# Patient Record
Sex: Female | Born: 2000 | Hispanic: No | Marital: Single | Smoking: Current every day smoker
Health system: Southern US, Community
[De-identification: ages and names within clinical notes are randomized; demographics above are authoritative.]

## PROBLEM LIST (undated history)

## (undated) DIAGNOSIS — N83209 Unspecified ovarian cyst, unspecified side: Secondary | ICD-10-CM

---
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
## 2018-10-25 ENCOUNTER — Emergency Department
Admission: EM | Admit: 2018-10-25 | Discharge: 2018-10-25 | Disposition: A | Discharge status: Home / Self Care | Payer: Medicaid Other | Attending: Emergency Medicine | Admitting: Emergency Medicine

## 2018-10-25 ENCOUNTER — Encounter: Payer: Self-pay | Admitting: Emergency Medicine

## 2018-10-25 ENCOUNTER — Other Ambulatory Visit: Payer: Self-pay

## 2018-10-25 ENCOUNTER — Emergency Department: Payer: Medicaid Other

## 2018-10-25 DIAGNOSIS — R102 Pelvic and perineal pain: Secondary | ICD-10-CM | POA: Diagnosis not present

## 2018-10-25 DIAGNOSIS — Z793 Long term (current) use of hormonal contraceptives: Secondary | ICD-10-CM | POA: Insufficient documentation

## 2018-10-25 DIAGNOSIS — F17210 Nicotine dependence, cigarettes, uncomplicated: Secondary | ICD-10-CM | POA: Insufficient documentation

## 2018-10-25 DIAGNOSIS — N939 Abnormal uterine and vaginal bleeding, unspecified: Secondary | ICD-10-CM | POA: Diagnosis not present

## 2018-10-25 DIAGNOSIS — N83201 Unspecified ovarian cyst, right side: Secondary | ICD-10-CM | POA: Diagnosis not present

## 2018-10-25 LAB — URINALYSIS, COMPLETE (UACMP) WITH MICROSCOPIC
Bacteria, UA: NONE SEEN
Bilirubin Urine: NEGATIVE
Glucose, UA: NEGATIVE mg/dL
Ketones, ur: 20 mg/dL — AB
Nitrite: NEGATIVE
Protein, ur: NEGATIVE mg/dL
Specific Gravity, Urine: 1.004 — ABNORMAL LOW (ref 1.005–1.030)
pH: 6 (ref 5.0–8.0)

## 2018-10-25 LAB — BASIC METABOLIC PANEL
Anion gap: 11 (ref 5–15)
BUN: 14 mg/dL (ref 6–20)
CO2: 22 mmol/L (ref 22–32)
Calcium: 9.5 mg/dL (ref 8.9–10.3)
Chloride: 106 mmol/L (ref 98–111)
Creatinine, Ser: 0.71 mg/dL (ref 0.44–1.00)
GFR calc Af Amer: >60 mL/min (ref >60)
GFR calc non Af Amer: >60 mL/min (ref >60)
Glucose, Bld: 93 mg/dL (ref 70–99)
Potassium: 3.4 mmol/L — ABNORMAL LOW (ref 3.5–5.1)
Sodium: 139 mmol/L (ref 135–145)

## 2018-10-25 LAB — CBC
HCT: 41.5 % (ref 36.0–46.0)
Hemoglobin: 13.7 g/dL (ref 12.0–15.0)
MCH: 31.2 pg (ref 26.0–34.0)
MCHC: 33 g/dL (ref 30.0–36.0)
MCV: 94.5 fL (ref 80.0–100.0)
Platelets: 288 10*3/uL (ref 150–400)
RBC: 4.39 MIL/uL (ref 3.87–5.11)
RDW: 12.2 % (ref 11.5–15.5)
WBC: 11.2 10*3/uL — ABNORMAL HIGH (ref 4.0–10.5)
nRBC: 0 % (ref 0.0–0.2)

## 2018-10-25 LAB — POC URINE PREG, ED: Preg Test, Ur: NEGATIVE

## 2018-10-25 MED ORDER — TRAMADOL HCL 50 MG PO TABS: 50.0000 mg | ORAL_TABLET | Freq: Four times a day (QID) | ORAL | 0 refills | Status: DC | PRN

## 2018-10-25 NOTE — ED Provider Notes (Signed)
[facility] Emergency Department Provider Note  ____________________________________________   First MD Initiated Contact with Patient 10/25/18 1437     (approximate)  I have reviewed the triage vital signs and the nursing notes.   HISTORY  Chief Complaint Vaginal Bleeding   HPI Sharon Underwood is a 18 y.o. female presents to the emergency department due to vaginal bleeding with "passing large clots" since yesterday.  Patient states her last menstrual period was 2 weeks ago.  Menstruation before that was in July.  Patient does admit to irregular menses.  Patient admits to pelvic and low back pain.  Patient denies any nausea vomiting diarrhea constipation or fever.        History reviewed. No pertinent past medical history.  There are no active problems to display for this patient.   History reviewed. No pertinent surgical history.  Prior to Admission medications   Medication Sig Start Date End Date Taking? Authorizing Provider  albuterol (PROVENTIL HFA;VENTOLIN HFA) 108 (90 Base) MCG/ACT inhaler Inhale 2 puffs into the lungs every 6 (six) hours as needed for wheezing or shortness of breath. 07/01/17   Jeanmarie Plant, MD  medroxyPROGESTERone (DEPO-PROVERA) 150 MG/ML injection use as directed inject 1 milliliter at physician's office every 3 months 02/22/15   [provider]  ondansetron (ZOFRAN) 4 MG tablet Take 1 tablet (4 mg total) by mouth daily as needed for nausea or vomiting. 07/01/17   Jeanmarie Plant, MD  traMADol (ULTRAM) 50 MG tablet Take 1 tablet (50 mg total) by mouth every 6 (six) hours as needed. 10/25/18 10/25/19  Darci Current, MD    Allergies Patient has no known allergies.  No family history on file.  Social History Social History   Tobacco Use  . Smoking status: Current Every Day Smoker    Types: Cigarettes  . Smokeless tobacco: Never Used  Substance Use Topics  . Alcohol use: No  . Drug use: Not Currently     Review of Systems Constitutional: No fever/chills Eyes: No visual changes. ENT: No sore throat. Cardiovascular: Denies chest pain. Respiratory: Denies shortness of breath. Gastrointestinal: Positive for pelvic pain.  No nausea, no vomiting.  No diarrhea.  No constipation. Genitourinary: Positive for vaginal bleeding Musculoskeletal: Negative for neck pain.  Positive for back pain. Integumentary: Negative for rash. Neurological: Negative for headaches, focal weakness or numbness.   ____________________________________________   PHYSICAL EXAM:  VITAL SIGNS: ED Triage Vitals  Enc Vitals Group     BP 10/25/18 1322 125/82     Pulse Rate 10/25/18 1322 96     Resp 10/25/18 1322 20     Temp 10/25/18 1322 98 F (36.7 C)     Temp Source 10/25/18 1322 Oral     SpO2 10/25/18 1322 97 %     Weight 10/25/18 1321 72.6 kg (160 lb)     Height 10/25/18 1321 1.575 m (5\' 2" )     Head Circumference --      Peak Flow --      Pain Score 10/25/18 1328 2     Pain Loc --      Pain Edu? --      Excl. in GC? --     Constitutional: Alert and oriented.  Eyes: Conjunctivae are normal.  Mouth/Throat: Mucous membranes are moist. Neck: No stridor.  No meningeal signs.   Cardiovascular: Normal rate, regular rhythm. Good peripheral circulation. Grossly normal heart sounds. Respiratory: Normal respiratory effort.  No retractions. Gastrointestinal: Lower quadrant/left lower  quadrant/suprapubic tenderness to palpation.  No distention.  Musculoskeletal: No lower extremity tenderness nor edema. No gross deformities of extremities. Neurologic:  Normal speech and language. No gross focal neurologic deficits are appreciated.  Skin:  Skin is warm, dry and intact. Psychiatric: Mood and affect are normal. Speech and behavior are normal.  ____________________________________________   LABS (all labs ordered are listed, but only abnormal results are displayed)  Labs Reviewed  BASIC METABOLIC PANEL - Abnormal;  Notable for the following components:      Result Value   Potassium 3.4 (*)    All other components within normal limits  CBC - Abnormal; Notable for the following components:   WBC 11.2 (*)    All other components within normal limits  URINALYSIS, COMPLETE (UACMP) WITH MICROSCOPIC - Abnormal; Notable for the following components:   Color, Urine STRAW (*)    APPearance CLEAR (*)    Specific Gravity, Urine 1.004 (*)    Hgb urine dipstick MODERATE (*)    Ketones, ur 20 (*)    Leukocytes,Ua TRACE (*)    All other components within normal limits  POC URINE PREG, ED    RADIOLOGY I, Kalida N Brylyn Novakovich, personally viewed and evaluated these images (plain radiographs) as part of my medical decision making, as well as reviewing the written report by the radiologist.  ED MD interpretation: Complex hypoechoic area within the right adnexa favoring a hemorrhagic ovarian cyst per radiologist on pelvic ultrasound interpretation.  Official radiology report(s): [place] Pelvic Complete With Transvaginal  Result Date: 10/25/2018 CLINICAL DATA:  Pelvic pain, vaginal bleeding EXAM: TRANSABDOMINAL AND TRANSVAGINAL ULTRASOUND OF PELVIS TECHNIQUE: Both transabdominal and transvaginal ultrasound examinations of the pelvis were performed. Transabdominal technique was performed for global imaging of the pelvis including uterus, ovaries, adnexal regions, and pelvic cul-de-sac. It was necessary to proceed with endovaginal exam following the transabdominal exam to visualize the uterus, endometrium, ovaries and adnexa. COMPARISON:  None FINDINGS: Uterus Measurements: 7.0 x 3.6 x 3.7 cm = volume: 49 mL. No fibroids or other mass visualized. Uterus is retroverted. Endometrium Thickness: 11 mm.  No focal abnormality visualized. Right ovary Measurements: 3.2 x 1.6 x 1.8 cm = volume: 4.9 mL. Complex hypoechoic area noted in the right adnexa measures 3.4 x 2.3 x 2.2 cm. Legrand RamsFavor this represents a hemorrhagic cyst within the right ovary.  This also could be tubular. Left ovary Measurements: 1.0 x 1.4 x 0.6 = volume: 0.4 mL. Normal appearance/no adnexal mass. Other findings No abnormal free fluid. IMPRESSION: Complex hypoechoic area within the right adnexa, favor hemorrhagic right ovarian cyst although this could be tubular in origin as well. This could be followed with repeat ultrasound in 3-6 months to ensure resolution. Electronically Signed   By: Charlett NoseKevin  Dover M.D.   On: 10/25/2018 16:23      Procedures   ____________________________________________   INITIAL IMPRESSION / MDM / ASSESSMENT AND PLAN / ED COURSE  As part of my medical decision making, I reviewed the following data within the electronic MEDICAL RECORD NUMBER   18 year old female presented with above-stated history and physical exam secondary to lower abdominal discomfort back pain and vaginal bleeding.  Urine pregnancy test negative.  Ultrasound performed to evaluate for possible ovarian cysts etiologies of dysfunctional uterine bleeding.  Ultrasound revealed a right hemorrhagic ovarian cyst.  I spoke with the patient at length regarding warning signs that would warrant immediate return to the emergency department including worsening pain fever.  Patient will be referred to OB/GYN Dr. Dalbert GarnetBeasley for  further outpatient evaluation.  ____________________________________________  FINAL CLINICAL IMPRESSION(S) / ED DIAGNOSES  Final diagnoses:  Vaginal bleeding  Pelvic pain  Cyst of right ovary     MEDICATIONS GIVEN DURING THIS VISIT:  Medications - No data to display   ED Discharge Orders         Ordered    traMADol (ULTRAM) 50 MG tablet  Every 6 hours PRN     10/25/18 1644          *Please note:  Sharon Underwood was evaluated in Emergency Department on 10/25/2018 for the symptoms described in the history of present illness. She was evaluated in the context of the global COVID-19 pandemic, which necessitated consideration that the patient might be at risk for  infection with the SARS-CoV-2 virus that causes COVID-19. Institutional protocols and algorithms that pertain to the evaluation of patients at risk for COVID-19 are in a state of rapid change based on information released by regulatory bodies including the CDC and federal and state organizations. These policies and algorithms were followed during the patient's care in the ED.  Some ED evaluations and interventions may be delayed as a result of limited staffing during the pandemic.*  Note:  This document was prepared using Dragon voice recognition software and may include unintentional dictation errors.   Gregor Hams, MD 10/25/18 [phone]

## 2018-10-25 NOTE — ED Triage Notes (Signed)
Pt c/o heavy vaginal bleeding that started yesterday with passing clots, states she just had her normal period 2 weeks ago.

## 2018-10-25 NOTE — ED Notes (Signed)
Patient transported to Ultrasound 

## 2018-12-22 ENCOUNTER — Encounter: Payer: Self-pay | Admitting: Emergency Medicine

## 2018-12-22 ENCOUNTER — Other Ambulatory Visit: Payer: Self-pay

## 2018-12-22 ENCOUNTER — Emergency Department: Payer: Medicaid Other

## 2018-12-22 ENCOUNTER — Observation Stay: Payer: Medicaid Other | Admitting: Anesthesiology

## 2018-12-22 ENCOUNTER — Encounter
Admission: EM | Disposition: A | Discharge status: Home / Self Care | Payer: Self-pay | Source: Home / Self Care | Attending: Emergency Medicine

## 2018-12-22 ENCOUNTER — Observation Stay
Admission: EM | Admit: 2018-12-22 | Discharge: 2018-12-23 | Disposition: A | Discharge status: Home / Self Care | Payer: Medicaid Other | Attending: Surgery | Admitting: Surgery

## 2018-12-22 DIAGNOSIS — Z20828 Contact with and (suspected) exposure to other viral communicable diseases: Secondary | ICD-10-CM | POA: Diagnosis not present

## 2018-12-22 DIAGNOSIS — J45909 Unspecified asthma, uncomplicated: Secondary | ICD-10-CM | POA: Diagnosis not present

## 2018-12-22 DIAGNOSIS — Z793 Long term (current) use of hormonal contraceptives: Secondary | ICD-10-CM | POA: Insufficient documentation

## 2018-12-22 DIAGNOSIS — K3580 Unspecified acute appendicitis: Principal | ICD-10-CM | POA: Insufficient documentation

## 2018-12-22 DIAGNOSIS — F17210 Nicotine dependence, cigarettes, uncomplicated: Secondary | ICD-10-CM | POA: Insufficient documentation

## 2018-12-22 DIAGNOSIS — K37 Unspecified appendicitis: Secondary | ICD-10-CM | POA: Diagnosis present

## 2018-12-22 HISTORY — PX: LAPAROSCOPIC APPENDECTOMY: SHX408

## 2018-12-22 LAB — URINALYSIS, COMPLETE (UACMP) WITH MICROSCOPIC
Bacteria, UA: NONE SEEN
Bilirubin Urine: NEGATIVE
Glucose, UA: NEGATIVE mg/dL
Hgb urine dipstick: NEGATIVE
Ketones, ur: 5 mg/dL — AB
Nitrite: NEGATIVE
Protein, ur: 30 mg/dL — AB
Specific Gravity, Urine: 1.031 — ABNORMAL HIGH (ref 1.005–1.030)
pH: 5 (ref 5.0–8.0)

## 2018-12-22 LAB — CBC WITH DIFFERENTIAL/PLATELET
Abs Immature Granulocytes: 0.06 10*3/uL (ref 0.00–0.07)
Basophils Absolute: 0.1 10*3/uL (ref 0.0–0.1)
Basophils Relative: 0 %
Eosinophils Absolute: 0.2 10*3/uL (ref 0.0–0.5)
Eosinophils Relative: 1 %
HCT: 41.1 % (ref 36.0–46.0)
Hemoglobin: 13.3 g/dL (ref 12.0–15.0)
Immature Granulocytes: 0 %
Lymphocytes Relative: 17 %
Lymphs Abs: 2.9 10*3/uL (ref 0.7–4.0)
MCH: 31.4 pg (ref 26.0–34.0)
MCHC: 32.4 g/dL (ref 30.0–36.0)
MCV: 97.2 fL (ref 80.0–100.0)
Monocytes Absolute: 0.9 10*3/uL (ref 0.1–1.0)
Monocytes Relative: 6 %
Neutro Abs: 12.8 10*3/uL — ABNORMAL HIGH (ref 1.7–7.7)
Neutrophils Relative %: 76 %
Platelets: 352 10*3/uL (ref 150–400)
RBC: 4.23 MIL/uL (ref 3.87–5.11)
RDW: 12.2 % (ref 11.5–15.5)
WBC: 16.9 10*3/uL — ABNORMAL HIGH (ref 4.0–10.5)
nRBC: 0 % (ref 0.0–0.2)

## 2018-12-22 LAB — TROPONIN I (HIGH SENSITIVITY): Troponin I (High Sensitivity): <2 ng/L (ref <18)

## 2018-12-22 LAB — COMPREHENSIVE METABOLIC PANEL
ALT: 30 U/L (ref 0–44)
AST: 18 U/L (ref 15–41)
Albumin: 4.9 g/dL (ref 3.5–5.0)
Alkaline Phosphatase: 76 U/L (ref 38–126)
Anion gap: 11 (ref 5–15)
BUN: 15 mg/dL (ref 6–20)
CO2: 25 mmol/L (ref 22–32)
Calcium: 9.2 mg/dL (ref 8.9–10.3)
Chloride: 104 mmol/L (ref 98–111)
Creatinine, Ser: 1.03 mg/dL — ABNORMAL HIGH (ref 0.44–1.00)
GFR calc Af Amer: >60 mL/min (ref >60)
GFR calc non Af Amer: >60 mL/min (ref >60)
Glucose, Bld: 109 mg/dL — ABNORMAL HIGH (ref 70–99)
Potassium: 3.3 mmol/L — ABNORMAL LOW (ref 3.5–5.1)
Sodium: 140 mmol/L (ref 135–145)
Total Bilirubin: 0.4 mg/dL (ref 0.3–1.2)
Total Protein: 8.1 g/dL (ref 6.5–8.1)

## 2018-12-22 LAB — POCT PREGNANCY, URINE: Preg Test, Ur: NEGATIVE

## 2018-12-22 LAB — URINE DRUG SCREEN, QUALITATIVE (ARMC ONLY)
Amphetamines, Ur Screen: NOT DETECTED
Barbiturates, Ur Screen: NOT DETECTED
Benzodiazepine, Ur Scrn: NOT DETECTED
Cannabinoid 50 Ng, Ur ~~LOC~~: POSITIVE — AB
Cocaine Metabolite,Ur ~~LOC~~: NOT DETECTED
MDMA (Ecstasy)Ur Screen: NOT DETECTED
Methadone Scn, Ur: NOT DETECTED
Opiate, Ur Screen: POSITIVE — AB
Phencyclidine (PCP) Ur S: NOT DETECTED
Tricyclic, Ur Screen: NOT DETECTED

## 2018-12-22 LAB — SARS CORONAVIRUS 2 BY RT PCR (HOSPITAL ORDER, PERFORMED IN ~~LOC~~ HOSPITAL LAB): SARS Coronavirus 2: NEGATIVE

## 2018-12-22 LAB — LIPASE, BLOOD: Lipase: 13 U/L (ref 11–51)

## 2018-12-22 LAB — HIV ANTIBODY (ROUTINE TESTING W REFLEX): HIV Screen 4th Generation wRfx: NONREACTIVE

## 2018-12-22 SURGERY — APPENDECTOMY, LAPAROSCOPIC
Anesthesia: General | Site: Abdomen

## 2018-12-22 MED ORDER — PIPERACILLIN-TAZOBACTAM 3.375 G IVPB
3.3750 g | Freq: Three times a day (TID) | INTRAVENOUS | Status: DC
  Administered 2018-12-22 – 2018-12-23 (×3): 3.375 g via INTRAVENOUS
  Filled 2018-12-22 (×5): qty 50

## 2018-12-22 MED ORDER — ONDANSETRON HCL 4 MG/2ML IJ SOLN
4.0000 mg | Freq: Once | INTRAMUSCULAR | Status: AC | PRN
  Administered 2018-12-22: 19:00:00 4 mg via INTRAVENOUS

## 2018-12-22 MED ORDER — SODIUM CHLORIDE 0.9 % IV SOLN: INTRAVENOUS | Status: DC

## 2018-12-22 MED ORDER — ROCURONIUM BROMIDE 50 MG/5ML IV SOLN
INTRAVENOUS | Status: AC
  Filled 2018-12-22: qty 1

## 2018-12-22 MED ORDER — ONDANSETRON HCL 4 MG/2ML IJ SOLN
4.0000 mg | Freq: Once | INTRAMUSCULAR | Status: AC
  Administered 2018-12-22: 4 mg via INTRAVENOUS
  Filled 2018-12-22: qty 2

## 2018-12-22 MED ORDER — PROPOFOL 10 MG/ML IV BOLUS
INTRAVENOUS | Status: DC | PRN
  Administered 2018-12-22: 200 mg via INTRAVENOUS

## 2018-12-22 MED ORDER — ONDANSETRON HCL 4 MG/2ML IJ SOLN
4.0000 mg | Freq: Four times a day (QID) | INTRAMUSCULAR | Status: DC | PRN
  Administered 2018-12-22: 4 mg via INTRAVENOUS

## 2018-12-22 MED ORDER — MORPHINE SULFATE (PF) 2 MG/ML IV SOLN
2.0000 mg | INTRAVENOUS | Status: DC | PRN
  Administered 2018-12-22 (×2): 2 mg via INTRAVENOUS
  Filled 2018-12-22 (×2): qty 1

## 2018-12-22 MED ORDER — KETOROLAC TROMETHAMINE 30 MG/ML IJ SOLN
INTRAMUSCULAR | Status: DC | PRN
  Administered 2018-12-22: 30 mg via INTRAVENOUS

## 2018-12-22 MED ORDER — ACETAMINOPHEN 10 MG/ML IV SOLN
INTRAVENOUS | Status: AC
  Filled 2018-12-22: qty 100

## 2018-12-22 MED ORDER — SUCCINYLCHOLINE CHLORIDE 20 MG/ML IJ SOLN
INTRAMUSCULAR | Status: DC | PRN
  Administered 2018-12-22: 100 mg via INTRAVENOUS

## 2018-12-22 MED ORDER — ONDANSETRON HCL 4 MG/2ML IJ SOLN
INTRAMUSCULAR | Status: DC | PRN
  Administered 2018-12-22: 4 mg via INTRAVENOUS

## 2018-12-22 MED ORDER — ACETAMINOPHEN 10 MG/ML IV SOLN
INTRAVENOUS | Status: DC | PRN
  Administered 2018-12-22: 1000 mg via INTRAVENOUS

## 2018-12-22 MED ORDER — GLYCOPYRROLATE 0.2 MG/ML IJ SOLN
INTRAMUSCULAR | Status: DC | PRN
  Administered 2018-12-22: 0.2 mg via INTRAVENOUS

## 2018-12-22 MED ORDER — HEPARIN SODIUM (PORCINE) 5000 UNIT/ML IJ SOLN
5000.0000 [IU] | Freq: Three times a day (TID) | INTRAMUSCULAR | Status: DC
  Administered 2018-12-23: 5000 [IU] via SUBCUTANEOUS
  Filled 2018-12-22 (×3): qty 1

## 2018-12-22 MED ORDER — OXYCODONE HCL 5 MG PO TABS
5.0000 mg | ORAL_TABLET | ORAL | Status: DC | PRN
  Administered 2018-12-23: 10 mg via ORAL
  Administered 2018-12-23: 5 mg via ORAL
  Filled 2018-12-22: qty 1
  Filled 2018-12-22: qty 2

## 2018-12-22 MED ORDER — FENTANYL CITRATE (PF) 100 MCG/2ML IJ SOLN
INTRAMUSCULAR | Status: DC | PRN
  Administered 2018-12-22 (×4): 50 ug via INTRAVENOUS

## 2018-12-22 MED ORDER — LIDOCAINE HCL (CARDIAC) PF 100 MG/5ML IV SOSY
PREFILLED_SYRINGE | INTRAVENOUS | Status: DC | PRN
  Administered 2018-12-22: 50 mg via INTRAVENOUS

## 2018-12-22 MED ORDER — BUPIVACAINE HCL (PF) 0.25 % IJ SOLN
INTRAMUSCULAR | Status: AC
  Filled 2018-12-22: qty 30

## 2018-12-22 MED ORDER — ONDANSETRON 4 MG PO TBDP: 4.0000 mg | ORAL_TABLET | Freq: Four times a day (QID) | ORAL | Status: DC | PRN

## 2018-12-22 MED ORDER — BUPIVACAINE-EPINEPHRINE 0.25% -1:200000 IJ SOLN
INTRAMUSCULAR | Status: DC | PRN
  Administered 2018-12-22: 30 mL

## 2018-12-22 MED ORDER — MORPHINE SULFATE (PF) 4 MG/ML IV SOLN
4.0000 mg | Freq: Once | INTRAVENOUS | Status: AC
  Administered 2018-12-22: 4 mg via INTRAVENOUS
  Filled 2018-12-22: qty 1

## 2018-12-22 MED ORDER — FENTANYL CITRATE (PF) 100 MCG/2ML IJ SOLN
INTRAMUSCULAR | Status: AC
  Filled 2018-12-22: qty 2

## 2018-12-22 MED ORDER — PROCHLORPERAZINE MALEATE 10 MG PO TABS
10.0000 mg | ORAL_TABLET | Freq: Four times a day (QID) | ORAL | Status: DC | PRN
  Filled 2018-12-22: qty 1

## 2018-12-22 MED ORDER — SODIUM CHLORIDE 0.9 % IV BOLUS
1000.0000 mL | Freq: Once | INTRAVENOUS | Status: AC
  Administered 2018-12-22: 1000 mL via INTRAVENOUS

## 2018-12-22 MED ORDER — MORPHINE SULFATE (PF) 2 MG/ML IV SOLN
2.0000 mg | Freq: Once | INTRAVENOUS | Status: AC
  Administered 2018-12-22: 2 mg via INTRAVENOUS
  Filled 2018-12-22: qty 1

## 2018-12-22 MED ORDER — SUGAMMADEX SODIUM 200 MG/2ML IV SOLN
INTRAVENOUS | Status: DC | PRN
  Administered 2018-12-22: 200 mg via INTRAVENOUS

## 2018-12-22 MED ORDER — DEXAMETHASONE SODIUM PHOSPHATE 10 MG/ML IJ SOLN
INTRAMUSCULAR | Status: DC | PRN
  Administered 2018-12-22: 10 mg via INTRAVENOUS

## 2018-12-22 MED ORDER — DEXMEDETOMIDINE HCL IN NACL 200 MCG/50ML IV SOLN
INTRAVENOUS | Status: DC | PRN
  Administered 2018-12-22 (×2): 12 ug via INTRAVENOUS

## 2018-12-22 MED ORDER — PIPERACILLIN-TAZOBACTAM 3.375 G IVPB 30 MIN
3.3750 g | Freq: Once | INTRAVENOUS | Status: AC
  Administered 2018-12-22: 3.375 g via INTRAVENOUS
  Filled 2018-12-22: qty 50

## 2018-12-22 MED ORDER — IOHEXOL 300 MG/ML  SOLN
100.0000 mL | Freq: Once | INTRAMUSCULAR | Status: AC | PRN
  Administered 2018-12-22: 100 mL via INTRAVENOUS

## 2018-12-22 MED ORDER — ONDANSETRON HCL 4 MG/2ML IJ SOLN
4.0000 mg | Freq: Four times a day (QID) | INTRAMUSCULAR | Status: DC | PRN
  Filled 2018-12-22: qty 2

## 2018-12-22 MED ORDER — ROCURONIUM BROMIDE 100 MG/10ML IV SOLN
INTRAVENOUS | Status: DC | PRN
  Administered 2018-12-22: 35 mg via INTRAVENOUS
  Administered 2018-12-22: 5 mg via INTRAVENOUS

## 2018-12-22 MED ORDER — KETOROLAC TROMETHAMINE 30 MG/ML IJ SOLN
30.0000 mg | Freq: Four times a day (QID) | INTRAMUSCULAR | Status: DC
  Administered 2018-12-22 – 2018-12-23 (×2): 30 mg via INTRAVENOUS
  Filled 2018-12-22 (×2): qty 1

## 2018-12-22 MED ORDER — SODIUM CHLORIDE 0.9 % IV SOLN
INTRAVENOUS | Status: DC
  Administered 2018-12-22 (×2): via INTRAVENOUS

## 2018-12-22 MED ORDER — ONDANSETRON HCL 4 MG/2ML IJ SOLN
INTRAMUSCULAR | Status: AC
  Administered 2018-12-22: 4 mg via INTRAVENOUS
  Filled 2018-12-22: qty 2

## 2018-12-22 MED ORDER — LACTATED RINGERS IV SOLN
INTRAVENOUS | Status: DC | PRN
  Administered 2018-12-22: 17:00:00 via INTRAVENOUS

## 2018-12-22 MED ORDER — MIDAZOLAM HCL 2 MG/2ML IJ SOLN
INTRAMUSCULAR | Status: AC
  Filled 2018-12-22: qty 2

## 2018-12-22 MED ORDER — FENTANYL CITRATE (PF) 100 MCG/2ML IJ SOLN
25.0000 ug | INTRAMUSCULAR | Status: DC | PRN
  Administered 2018-12-22 (×2): 25 ug via INTRAVENOUS

## 2018-12-22 MED ORDER — EPINEPHRINE PF 1 MG/ML IJ SOLN
INTRAMUSCULAR | Status: AC
  Filled 2018-12-22: qty 1

## 2018-12-22 MED ORDER — ONDANSETRON HCL 4 MG/2ML IJ SOLN
INTRAMUSCULAR | Status: AC
  Filled 2018-12-22: qty 2

## 2018-12-22 MED ORDER — DEXAMETHASONE SODIUM PHOSPHATE 10 MG/ML IJ SOLN
INTRAMUSCULAR | Status: AC
  Filled 2018-12-22: qty 1

## 2018-12-22 MED ORDER — MIDAZOLAM HCL 2 MG/2ML IJ SOLN
INTRAMUSCULAR | Status: DC | PRN
  Administered 2018-12-22: 2 mg via INTRAVENOUS

## 2018-12-22 MED ORDER — FENTANYL CITRATE (PF) 100 MCG/2ML IJ SOLN
INTRAMUSCULAR | Status: AC
  Administered 2018-12-22: 25 ug via INTRAVENOUS
  Filled 2018-12-22: qty 2

## 2018-12-22 MED ORDER — PROCHLORPERAZINE EDISYLATE 10 MG/2ML IJ SOLN
5.0000 mg | Freq: Four times a day (QID) | INTRAMUSCULAR | Status: DC | PRN
  Filled 2018-12-22: qty 2

## 2018-12-22 SURGICAL SUPPLY — 38 items
APPLIER CLIP 5 13 M/L LIGAMAX5 (MISCELLANEOUS) ×3
BLADE CLIPPER SURG (BLADE) ×3 IMPLANT
CANISTER SUCT 1200ML W/VALVE (MISCELLANEOUS) ×3 IMPLANT
CHLORAPREP W/TINT 26 (MISCELLANEOUS) ×3 IMPLANT
CLIP APPLIE 5 13 M/L LIGAMAX5 (MISCELLANEOUS) ×1 IMPLANT
COVER WAND RF STERILE (DRAPES) ×3 IMPLANT
CUTTER FLEX LINEAR 45M (STAPLE) ×3 IMPLANT
DERMABOND ADVANCED (GAUZE/BANDAGES/DRESSINGS) ×2
DERMABOND ADVANCED .7 DNX12 (GAUZE/BANDAGES/DRESSINGS) ×1 IMPLANT
ELECT CAUTERY BLADE 6.4 (BLADE) ×3 IMPLANT
ELECT REM PT RETURN 9FT ADLT (ELECTROSURGICAL) ×3
ELECTRODE REM PT RTRN 9FT ADLT (ELECTROSURGICAL) ×1 IMPLANT
GLOVE BIO SURGEON STRL SZ7 (GLOVE) ×3 IMPLANT
GOWN STRL REUS W/ TWL LRG LVL3 (GOWN DISPOSABLE) ×2 IMPLANT
GOWN STRL REUS W/TWL LRG LVL3 (GOWN DISPOSABLE) ×4
IRRIGATION STRYKERFLOW (MISCELLANEOUS) ×1 IMPLANT
IRRIGATOR STRYKERFLOW (MISCELLANEOUS) ×3
IV NS 1000ML (IV SOLUTION) ×2
IV NS 1000ML BAXH (IV SOLUTION) ×1 IMPLANT
NEEDLE HYPO 22GX1.5 SAFETY (NEEDLE) ×3 IMPLANT
NS IRRIG 500ML POUR BTL (IV SOLUTION) ×3 IMPLANT
PACK LAP CHOLECYSTECTOMY (MISCELLANEOUS) ×3 IMPLANT
PENCIL ELECTRO HAND CTR (MISCELLANEOUS) ×3 IMPLANT
POUCH SPECIMEN RETRIEVAL 10MM (ENDOMECHANICALS) ×3 IMPLANT
RELOAD 45 VASCULAR/THIN (ENDOMECHANICALS) ×3 IMPLANT
RELOAD STAPLE TA45 3.5 REG BLU (ENDOMECHANICALS) ×6 IMPLANT
SCISSORS METZENBAUM CVD 33 (INSTRUMENTS) IMPLANT
SHEARS HARMONIC ACE PLUS 36CM (ENDOMECHANICALS) ×3 IMPLANT
SLEEVE ENDOPATH XCEL 5M (ENDOMECHANICALS) ×3 IMPLANT
SPONGE LAP 18X18 RF (DISPOSABLE) ×3 IMPLANT
SPONGE LAP 4X18 RFD (DISPOSABLE) ×3 IMPLANT
SUT MNCRL AB 4-0 PS2 18 (SUTURE) ×6 IMPLANT
SUT VICRYL 0 AB UR-6 (SUTURE) ×6 IMPLANT
SYR 20ML LL LF (SYRINGE) ×3 IMPLANT
TRAY FOLEY MTR SLVR 16FR STAT (SET/KITS/TRAYS/PACK) IMPLANT
TROCAR XCEL BLUNT TIP 100MML (ENDOMECHANICALS) ×3 IMPLANT
TROCAR XCEL NON-BLD 5MMX100MML (ENDOMECHANICALS) ×6 IMPLANT
TUBING EVAC SMOKE HEATED PNEUM (TUBING) ×3 IMPLANT

## 2018-12-22 NOTE — ED Triage Notes (Signed)
Patient ambulatory to triage with steady gait, without difficulty or distress noted, mask in place; pt reports since 9pm having lower chest wall pain radiating into her abd and back accomp by nausea

## 2018-12-22 NOTE — ED Notes (Signed)
Pt's mother called; pt given phone to speak to mother

## 2018-12-22 NOTE — ED Notes (Signed)
Pt last ate/drank at 2200 11/21

## 2018-12-22 NOTE — H&P (Signed)
Patient ID: Sharon Underwood, female   DOB: 05/10/2000, 18 y.o.   MRN: [id number]  HPI Sharon Underwood is a 18 y.o. female seen in consultation for an acute history of abdominal pain within the right lower quadrant.  She reports that the pain sharp started last night has been constant moderate to severe intensity.  Associated with nausea.  She does admit some chills.  No fevers.  Pain get worse when she moves.  She does have decreased appetite.  No sick contacts.  She does admit smoking and using marijuana products.  She is able to perform more than 6 METS of activity without any shortness of breath or chest pain.  Part of the work-up occluded a CT scan of the abdomen and pelvis that I have personally reviewed showing evidence of appendicitis.  No ruptured no free air no abscess.  She also had CBC showing evidence of a white count of 16,000 and a slight bump in creatinine to 1.0.  All the other labs are within normal limits.  HPI  History reviewed. No pertinent past medical history.  History reviewed. No pertinent surgical history.  No pertinent family history Social History Social History   Tobacco Use  . Smoking status: Current Every Day Smoker    Types: Cigarettes  . Smokeless tobacco: Never Used  Substance Use Topics  . Alcohol use: No  . Drug use: Not Currently    No Known Allergies  Current Facility-Administered Medications  Medication Dose Route Frequency Provider Last Rate Last Dose  . 0.9 %  sodium chloride infusion   Intravenous Continuous Donovan Kail, PA-C      . morphine 2 MG/ML injection 2-4 mg  2-4 mg Intravenous Q3H PRN Donovan Kail, PA-C      . ondansetron (ZOFRAN-ODT) disintegrating tablet 4 mg  4 mg Oral Q6H PRN Donovan Kail, PA-C       Or  . ondansetron [facility]) injection 4 mg  4 mg Intravenous Q6H PRN Donovan Kail, PA-C      . piperacillin-tazobactam (ZOSYN) IVPB 3.375 g  3.375 g Intravenous Once Darci Current, MD 100 mL/hr at 12/22/18 0731 3.375  g at 12/22/18 0731  . piperacillin-tazobactam (ZOSYN) IVPB 3.375 g  3.375 g Intravenous Q8H Donovan Kail, [place]       Current Outpatient Medications  Medication Sig Dispense Refill  . albuterol (PROVENTIL HFA;VENTOLIN HFA) 108 (90 Base) MCG/ACT inhaler Inhale 2 puffs into the lungs every 6 (six) hours as needed for wheezing or shortness of breath. 1 Inhaler 2     Review of Systems Full ROS  was asked and was negative except for the information on the HPI  Physical Exam Blood pressure 102/82, pulse 68, temperature 98.4 F (36.9 C), temperature source Oral, resp. rate 11, height 5\' 2"  (1.575 m), weight 74.8 kg, last menstrual period 12/15/2018, SpO2 100 %, unknown if currently breastfeeding. CONSTITUTIONAL: NAD EYES: Pupils are equal, round, and reactive to light, Sclera are non-icteric. EARS, NOSE, MOUTH AND THROAT: The oropharynx is clear. The oral mucosa is pink and moist. Hearing is intact to voice. LYMPH NODES:  Lymph nodes in the neck are normal. RESPIRATORY:  Lungs are clear. There is normal respiratory effort, with equal breath sounds bilaterally, and without pathologic use of accessory muscles. CARDIOVASCULAR: Heart is regular without murmurs, gallops, or rubs. GI: The abdomen is  soft, distended to palpation the right lower quadrant with focal peritonitis .  There is no overt peritonitis.  No  other masses or scars appreciated.  She has normal bowel sounds GU: Rectal deferred.   MUSCULOSKELETAL: Normal muscle strength and tone. No cyanosis or edema.   SKIN: Turgor is good and there are no pathologic skin lesions or ulcers. NEUROLOGIC: Motor and sensation is grossly normal. Cranial nerves are grossly intact. PSYCH:  Oriented to person, place and time. Affect is normal.  Data Reviewed  I have personally reviewed the patient's imaging, laboratory findings and medical records.    Assessment/Plan 18 year old female with classic signs and symptoms of acute appendicitis confirmed  by CT.  Discussed with the patient in detail about her disease process and I do recommend appendectomy.  We will check for urine drug screen since she admits of marijuana products.  She is also pending Covid testing.  We will bolus her with another liter of fluid and we will post her today for laparoscopic appendectomy pending or availability The risks, benefits, complications, treatment options, and expected outcomes were discussed with the patient. The treatment of antibiotics alone was discussed,   Also discussed continuing to the operating room for Laparoscopic Appendectomy.  The possibilities of  bleeding, recurrent infection, perforation of viscus, finding a normal appendix, the need for additional procedures, failure to diagnose a condition, conversion to open procedure and creating a complication requiring transfusion or further operations were discussed. The patient was given the opportunity to ask questions and have them answered.  Patient would like to proceed with Laparoscopic Appendectomy and consent was obtained.     Caroleen Hamman, MD FACS General Surgeon 12/22/2018, 7:58 AM

## 2018-12-22 NOTE — Anesthesia Post-op Follow-up Note (Signed)
Anesthesia QCDR form completed.        

## 2018-12-22 NOTE — Anesthesia Procedure Notes (Signed)
Procedure Name: Intubation Performed by: Jany Buckwalter, CRNA Pre-anesthesia Checklist: Patient identified, Patient being monitored, Timeout performed, Emergency Drugs available and Suction available Patient Re-evaluated:Patient Re-evaluated prior to induction Oxygen Delivery Method: Circle system utilized Preoxygenation: Pre-oxygenation with 100% oxygen Induction Type: IV induction and Rapid sequence Laryngoscope Size: Miller and 2 Grade View: Grade I Tube type: Oral Tube size: 7.0 mm Number of attempts: 1 Airway Equipment and Method: Stylet Placement Confirmation: ETT inserted through vocal cords under direct vision,  positive ETCO2 and breath sounds checked- equal and bilateral Secured at: 21 cm Tube secured with: Tape Dental Injury: Teeth and Oropharynx as per pre-operative assessment        

## 2018-12-22 NOTE — ED Provider Notes (Addendum)
[facility] Emergency Department Provider Note _______________   First MD Initiated Contact with Patient 12/22/18 [phone]     (approximate)  I have reviewed the triage vital signs and the nursing notes.   HISTORY  Chief Complaint Chest Pain    HPI Sharon Underwood is a 18 y.o. female presents to the emergency department secondary to acute onset of generalized abdominal pain which patient states began at 9 PM last night.  Patient also admits to multiple episodes of vomiting.  Patient denies any fever no diarrhea or constipation.  Patient denies any urinary symptoms.        History reviewed. No pertinent past medical history.  There are no active problems to display for this patient.   History reviewed. No pertinent surgical history.  Prior to Admission medications   Medication Sig Start Date End Date Taking? Authorizing Provider  albuterol (PROVENTIL HFA;VENTOLIN HFA) 108 (90 Base) MCG/ACT inhaler Inhale 2 puffs into the lungs every 6 (six) hours as needed for wheezing or shortness of breath. 07/01/17   Jeanmarie Plant, MD  medroxyPROGESTERone (DEPO-PROVERA) 150 MG/ML injection use as directed inject 1 milliliter at physician's office every 3 months 02/22/15   [provider]  ondansetron (ZOFRAN) 4 MG tablet Take 1 tablet (4 mg total) by mouth daily as needed for nausea or vomiting. 07/01/17   Jeanmarie Plant, MD  traMADol (ULTRAM) 50 MG tablet Take 1 tablet (50 mg total) by mouth every 6 (six) hours as needed. 10/25/18 10/25/19  Darci Current, MD    Allergies Patient has no known allergies.  No family history on file.  Social History Social History   Tobacco Use   Smoking status: Current Every Day Smoker    Types: Cigarettes   Smokeless tobacco: Never Used  Substance Use Topics   Alcohol use: No   Drug use: Not Currently    Review of Systems Constitutional: No fever/chills Eyes: No visual changes. ENT: No sore  throat. Cardiovascular: Denies chest pain. Respiratory: Denies shortness of breath. Gastrointestinal: Positive for abdominal pain nausea and vomiting Genitourinary: Negative for dysuria. Musculoskeletal: Negative for neck pain.  Negative for back pain. Integumentary: Negative for rash. Neurological: Negative for headaches, focal weakness or numbness.  ____________________________________________   PHYSICAL EXAM:  VITAL SIGNS: ED Triage Vitals  Enc Vitals Group     BP 12/22/18 0416 130/70     Pulse Rate 12/22/18 0416 68     Resp 12/22/18 0416 18     Temp 12/22/18 0416 98.4 F (36.9 C)     Temp Source 12/22/18 0416 Oral     SpO2 12/22/18 0416 100 %     Weight 12/22/18 0415 74.8 kg (165 lb)     Height 12/22/18 0415 1.575 m (5\' 2" )     Head Circumference --      Peak Flow --      Pain Score 12/22/18 0415 8     Pain Loc --      Pain Edu? --      Excl. in GC? --     Constitutional: Alert and oriented.  Eyes: Conjunctivae are normal.  Mouth/Throat: Patient is wearing a mask. Neck: No stridor.  No meningeal signs.   Cardiovascular: Normal rate, regular rhythm. Good peripheral circulation. Grossly normal heart sounds. Respiratory: Normal respiratory effort.  No retractions. Gastrointestinal: Generalized tenderness to palpation. No distention.  Musculoskeletal: No lower extremity tenderness nor edema. No gross deformities of extremities. Neurologic:  Normal speech and language.  No gross focal neurologic deficits are appreciated.  Skin:  Skin is warm, dry and intact. Psychiatric: Mood and affect are normal. Speech and behavior are normal.  ____________________________________________   LABS (all labs ordered are listed, but only abnormal results are displayed)  Labs Reviewed  CBC WITH DIFFERENTIAL/PLATELET - Abnormal; Notable for the following components:      Result Value   WBC 16.9 (*)    Neutro Abs 12.8 (*)    All other components within normal limits  COMPREHENSIVE  METABOLIC PANEL - Abnormal; Notable for the following components:   Potassium 3.3 (*)    Glucose, Bld 109 (*)    Creatinine, Ser 1.03 (*)    All other components within normal limits  URINALYSIS, COMPLETE (UACMP) WITH MICROSCOPIC - Abnormal; Notable for the following components:   Color, Urine YELLOW (*)    APPearance CLOUDY (*)    Specific Gravity, Urine 1.031 (*)    Ketones, ur 5 (*)    Protein, ur 30 (*)    Leukocytes,Ua MODERATE (*)    All other components within normal limits  LIPASE, BLOOD  POCT PREGNANCY, URINE  TROPONIN I (HIGH SENSITIVITY)   ____________________________________________  EKG  ED ECG REPORT I, [place] Zade Falkner, the attending physician, personally viewed and interpreted this ECG.   Date: 12/22/2018  EKG Time: 4:15 AM  Rate: 64  Rhythm: Normal sinus rhythm  Axis: Rightward axis  Intervals: Normal  ST&T Change: None  ____________________________________________  RADIOLOGY I, [place] N Mikka Kissner, personally viewed and evaluated these images (plain radiographs) as part of my medical decision making, as well as reviewing the written report by the radiologist.  ED MD interpretation: Persistent but improved bronchial thickening comparison 2019 chest x-ray per radiologist.   CT abdomen pelvis revealed acute appendicitis without perforation or abscess.   Official radiology report(s): Dg Chest 2 View  Result Date: 12/22/2018 CLINICAL DATA:  Chest pain. EXAM: CHEST - 2 VIEW COMPARISON:  Radiograph 07/02/2017 FINDINGS: The cardiomediastinal contours are normal. Bronchial thickening which has improved from 2019 exam. Pulmonary vasculature is normal. No consolidation, pleural effusion, or pneumothorax. No acute osseous abnormalities are seen. IMPRESSION: Persistent but improved bronchial thickening from 2019 exam. No new abnormality. Electronically Signed   By: Narda RutherfordMelanie  Sanford M.D.   On: 12/22/2018 04:57   Ct Abdomen Pelvis W Contrast  Result Date:  12/22/2018 CLINICAL DATA:  Acute generalized abdominal pain. EXAM: CT ABDOMEN AND PELVIS WITH CONTRAST TECHNIQUE: Multidetector CT imaging of the abdomen and pelvis was performed using the standard protocol following bolus administration of intravenous contrast. CONTRAST:  100mL OMNIPAQUE IOHEXOL 300 MG/ML  SOLN COMPARISON:  None. FINDINGS: Lower chest: Lung bases are clear. Hepatobiliary: Minimal focal fatty infiltration adjacent to the falciform ligament. No suspicious hepatic lesion. Gallbladder physiologically distended, no calcified stone. Small Phrygian cap. No biliary dilatation. Pancreas: No ductal dilatation or inflammation. Spleen: Normal in size without focal abnormality. Adrenals/Urinary Tract: Normal adrenal glands. No hydronephrosis or perinephric edema. Homogeneous renal enhancement. Urinary bladder is minimally distended no bladder wall thickening. Stomach/Bowel: The proximal appendix is normal, however the distal appendix is dilated measuring 11 mm with faint periappendiceal fat stranding. Probable appendicolith in the mid appendix just proximal to the area of inflammation, series 5, image 33. no perforation or abscess. Few fluid-filled distal ileal bowel loops likely reactive. No evidence of obstruction or other bowel inflammation. Stomach is unremarkable. Vascular/Lymphatic: Abdominal aorta is normal in caliber. The portal vein is patent. Prominent ileocolic nodes are likely reactive. Reproductive: Retroverted uterus.  Cystic changes in the right ovary/adnexa which is prominent size measuring 4.0 x 2.5 x 3.6 cm. Left ovary/adnexa are normal. Other: Small amount of free fluid in the pelvis may be physiologic or reactive. No free air. No intra-abdominal abscess. Musculoskeletal: There are no acute or suspicious osseous abnormalities. IMPRESSION: 1. Findings consistent with uncomplicated tip appendicitis. 2. Cystic changes in the right ovary with slight ovarian enlargement, pelvic ultrasound 2 months  ago demonstrated complex hypoechoic area. Consider follow-up pelvic ultrasound after resolution of acute event. Electronically Signed   By: Keith Rake M.D.   On: 12/22/2018 06:58    ___________________________________  Procedures   ____________________________________________   INITIAL IMPRESSION / MDM / [place] / ED COURSE  As part of my medical decision making, I reviewed the following data within the electronic MEDICAL RECORD NUMBER   18 year old female presenting with above-stated history and physical exam secondary to generalized abdominal pain with associated vomiting.  Concern for possible cholelithiasis cholecystitis appendicitis less likely diverticulitis as such CT scan was performed which revealed acute appendicitis.  Patient discussed with Dr Lysle Pearl general surgeon who will evaluate the patient emergency department..  Patient was given IV morphine and Zofran in the emergency department.  ____________________________________________  FINAL CLINICAL IMPRESSION(S) / ED DIAGNOSES  Final diagnoses:  Acute appendicitis, unspecified acute appendicitis type     MEDICATIONS GIVEN DURING THIS VISIT:  Medications  morphine 4 MG/ML injection 4 mg (4 mg Intravenous Given 12/22/18 0618)  ondansetron (ZOFRAN) injection 4 mg (4 mg Intravenous Given 12/22/18 0618)  sodium chloride 0.9 % bolus 1,000 mL (1,000 mLs Intravenous New Bag/Given 12/22/18 0618)  iohexol (OMNIPAQUE) 300 MG/ML solution 100 mL (100 mLs Intravenous Contrast Given 12/22/18 0630)  morphine 2 MG/ML injection 2 mg (2 mg Intravenous Given 12/22/18 0636)  ondansetron (ZOFRAN) injection 4 mg (4 mg Intravenous Given 12/22/18 0636)     ED Discharge Orders    None      *Please note:  Sharon Underwood was evaluated in Emergency Department on 12/22/2018 for the symptoms described in the history of present illness. She was evaluated in the context of the global COVID-19 pandemic, which necessitated consideration  that the patient might be at risk for infection with the SARS-CoV-2 virus that causes COVID-19. Institutional protocols and algorithms that pertain to the evaluation of patients at risk for COVID-19 are in a state of rapid change based on information released by regulatory bodies including the CDC and federal and state organizations. These policies and algorithms were followed during the patient's care in the ED.  Some ED evaluations and interventions may be delayed as a result of limited staffing during the pandemic.*  Note:  This document was prepared using Dragon voice recognition software and may include unintentional dictation errors.   Gregor Hams, MD 12/22/18 0626    Gregor Hams, MD 12/22/18 [phone]

## 2018-12-22 NOTE — ED Notes (Signed)
Floor unable to take report at this time.

## 2018-12-22 NOTE — Progress Notes (Signed)
Pre-procedure checklist complete. Patient transported to OR by axillary via bed at this time.     Hilbert Bible, RN

## 2018-12-22 NOTE — ED Notes (Signed)
Surgeon at bedside explaining procedure risks/benefits

## 2018-12-22 NOTE — Op Note (Signed)
laparascopic appendectomy   Sharon Underwood Date of operation:  12/22/2018  Indications: The patient presented with a history of  abdominal pain. Workup has revealed findings consistent with acute appendicitis.  Pre-operative Diagnosis: Acute appendicitis without mention of peritonitis  Post-operative Diagnosis: Same  Surgeon: Caroleen Hamman, MD, FACS  Anesthesia: General with endotracheal tube  Findings: Acute non perforated appendicitis  Estimated Blood Loss: 5cc         Specimens: appendix         Complications:  none  Procedure Details  The patient was seen again in the preop area. The options of surgery versus observation were reviewed with the patient and/or family. The risks of bleeding, infection, recurrence of symptoms, negative laparoscopy, potential for an open procedure, bowel injury, abscess or infection, were all reviewed as well. The patient was taken to Operating Room, identified as Sharon Underwood and the procedure verified as laparoscopic appendectomy. A Time Out was held and the above information confirmed.  The patient was placed in the supine position and general anesthesia was induced.  Antibiotic prophylaxis was administered and VT E prophylaxis was in place. A Foley catheter was placed by the nursing staff.   The abdomen was prepped and draped in a sterile fashion. An infraumbilical incision was made. A cutdown technique was used to enter the abdominal cavity. Two vicryl stitches were placed on the fascia and a Hasson trocar inserted. Pneumoperitoneum obtained. Two 5 mm ports were placed under direct visualization.   The appendix was identified and found to be acutely inflamed  The appendix was carefully dissected. The mesoappendix was divided withHarmonic scalpel. The base of the appendix was dissected out and divided with a standard load Endo GIA.The appendix was placed in a Endo Catch bag and removed via the Hasson port. The right lower quadrant and pelvis was then  irrigated with  normal saline which was aspirated. Inspection  failed to identify any additional bleeding and there were no signs of bowel injury. Again the right lower quadrant was inspected there was no sign of bleeding or bowel injury therefore pneumoperitoneum was released, all ports were removed.  The umbilical fascia was closed with 0 Vicryl interrupted sutures and the skin incisions were approximated with subcuticular 4-0 Monocryl. Dermabond was placed The patient tolerated the procedure well, there were no complications. The sponge lap and needle count were correct at the end of the procedure.  The patient was taken to the recovery room in stable condition to be admitted for continued care.    Caroleen Hamman, MD FACS

## 2018-12-22 NOTE — Anesthesia Preprocedure Evaluation (Signed)
Anesthesia Evaluation  Patient identified by MRN, date of birth, ID band Patient awake    Airway Mallampati: II  TM Distance: >3 FB     Dental  (+) Teeth Intact   Pulmonary asthma , Current Smoker,    Pulmonary exam normal        Cardiovascular negative cardio ROS Normal cardiovascular exam     Neuro/Psych negative neurological ROS  negative psych ROS   GI/Hepatic Neg liver ROS,   Endo/Other  negative endocrine ROS  Renal/GU negative Renal ROS  negative genitourinary   Musculoskeletal   Abdominal   Peds negative pediatric ROS (+)  Hematology negative hematology ROS (+)   Anesthesia Other Findings History reviewed. No pertinent past medical history.  Reproductive/Obstetrics                             Anesthesia Physical Anesthesia Plan  ASA: II and emergent  Anesthesia Plan: General   Post-op Pain Management:    Induction: Intravenous, Rapid sequence and Cricoid pressure planned  PONV Risk Score and Plan:   Airway Management Planned: Oral ETT  Additional Equipment:   Intra-op Plan:   Post-operative Plan: Extubation in OR  Informed Consent: I have reviewed the patients History and Physical, chart, labs and discussed the procedure including the risks, benefits and alternatives for the proposed anesthesia with the patient or authorized representative who has indicated his/her understanding and acceptance.     Dental advisory given  Plan Discussed with: CRNA and Surgeon  Anesthesia Plan Comments:         Anesthesia Quick Evaluation

## 2018-12-22 NOTE — Transfer of Care (Signed)
Immediate Anesthesia Transfer of Care Note  Patient: Sharon Underwood  Procedure(s) Performed: APPENDECTOMY LAPAROSCOPIC (N/A Abdomen)  Patient Location: PACU  Anesthesia Type:General  Level of Consciousness: sedated  Airway & Oxygen Therapy: Patient Spontanous Breathing and Patient connected to face mask oxygen  Post-op Assessment: Report given to RN and Post -op Vital signs reviewed and stable  Post vital signs: Reviewed  Last Vitals:  Vitals Value Taken Time  BP 109/63 12/22/18 1813  Temp    Pulse 86 12/22/18 1813  Resp 13 12/22/18 1813  SpO2 100 % 12/22/18 1813  Vitals shown include unvalidated device data.  Last Pain:  Vitals:   12/22/18 1639  TempSrc:   PainSc: 6          Complications: No apparent anesthesia complications

## 2018-12-23 ENCOUNTER — Encounter: Payer: Self-pay | Admitting: Surgery

## 2018-12-23 LAB — BASIC METABOLIC PANEL
Anion gap: 8 (ref 5–15)
BUN: 9 mg/dL (ref 6–20)
CO2: 22 mmol/L (ref 22–32)
Calcium: 8.6 mg/dL — ABNORMAL LOW (ref 8.9–10.3)
Chloride: 108 mmol/L (ref 98–111)
Creatinine, Ser: 0.75 mg/dL (ref 0.44–1.00)
GFR calc Af Amer: >60 mL/min (ref >60)
GFR calc non Af Amer: >60 mL/min (ref >60)
Glucose, Bld: 134 mg/dL — ABNORMAL HIGH (ref 70–99)
Potassium: 4.1 mmol/L (ref 3.5–5.1)
Sodium: 138 mmol/L (ref 135–145)

## 2018-12-23 LAB — CBC
HCT: 35 % — ABNORMAL LOW (ref 36.0–46.0)
Hemoglobin: 11.4 g/dL — ABNORMAL LOW (ref 12.0–15.0)
MCH: 31.3 pg (ref 26.0–34.0)
MCHC: 32.6 g/dL (ref 30.0–36.0)
MCV: 96.2 fL (ref 80.0–100.0)
Platelets: 299 10*3/uL (ref 150–400)
RBC: 3.64 MIL/uL — ABNORMAL LOW (ref 3.87–5.11)
RDW: 12 % (ref 11.5–15.5)
WBC: 17.6 10*3/uL — ABNORMAL HIGH (ref 4.0–10.5)
nRBC: 0 % (ref 0.0–0.2)

## 2018-12-23 MED ORDER — HYDROCODONE-ACETAMINOPHEN 5-325 MG PO TABS: 1.0000 | ORAL_TABLET | Freq: Four times a day (QID) | ORAL | 0 refills | Status: AC | PRN

## 2018-12-23 NOTE — Progress Notes (Signed)
Discharge instructions, prescriptions, education, and appointments given and explained. Pt verbalized understanding with no further questions 

## 2018-12-23 NOTE — Anesthesia Postprocedure Evaluation (Signed)
Anesthesia Post Note  Patient: Sharon Underwood  Procedure(s) Performed: APPENDECTOMY LAPAROSCOPIC (N/A Abdomen)  Patient location during evaluation: PACU Anesthesia Type: General Level of consciousness: awake and alert and oriented Pain management: pain level controlled Vital Signs Assessment: post-procedure vital signs reviewed and stable Respiratory status: spontaneous breathing Cardiovascular status: blood pressure returned to baseline     Last Vitals:  Vitals:   12/23/18 1130 12/23/18 1210  BP: (!) 96/52 111/76  Pulse: (!) 51 (!) 54  Resp: 18   Temp: 36.7 C 36.4 C  SpO2: 98% 99%    Last Pain:  Vitals:   12/23/18 1215  TempSrc:   PainSc: 0-No pain                 Shylynn Bruning

## 2018-12-23 NOTE — Discharge Instructions (Signed)
Laparoscopic Appendectomy, Adult, Care After °This sheet gives you information about how to care for yourself after your procedure. Your health care provider may also give you more specific instructions. If you have problems or questions, contact your health care provider. °What can I expect after the procedure? °After the procedure, it is common to have: °· Little energy for normal activities. °· Mild pain in the area where the incisions were made. °· Difficulty passing stool (constipation). This can be caused by: °? Pain medicine. °? A decrease in your activity. °Follow these instructions at home: °Medicines °· Take over-the-counter and prescription medicines only as told by your health care provider. °· If you were prescribed an antibiotic medicine, take it as told by your health care provider. Do not stop taking the antibiotic even if you start to feel better. °· Do not drive or use heavy machinery while taking prescription pain medicine. °· Ask your health care provider if the medicine prescribed to you can cause constipation. You may need to take steps to prevent or treat constipation, such as: °? Drink enough fluid to keep your urine pale yellow. °? Take over-the-counter or prescription medicines. °? Eat foods that are high in fiber, such as beans, whole grains, and fresh fruits and vegetables. °? Limit foods that are high in fat and processed sugars, such as fried or sweet foods. °Incision care ° °· Follow instructions from your health care provider about how to take care of your incisions. Make sure you: °? Wash your hands with soap and water before and after you change your bandage (dressing). If soap and water are not available, use hand sanitizer. °? Change your dressing as told by your health care provider. °? Leave stitches (sutures), skin glue, or adhesive strips in place. These skin closures may need to stay in place for 2 weeks or longer. If adhesive strip edges start to loosen and curl up, you may  trim the loose edges. Do not remove adhesive strips completely unless your health care provider tells you to do that. °· Check your incision areas every day for signs of infection. Check for: °? Redness, swelling, or pain. °? Fluid or blood. °? Warmth. °? Pus or a bad smell. °Bathing °· Keep your incisions clean and dry. Clean them as often as told by your health care provider. To do this: °1. Gently wash the incisions with soap and water. °2. Rinse the incisions with water to remove all soap. °3. Pat the incisions dry with a clean towel. Do not rub the incisions. °· Do not take baths, swim, or use a hot tub for 2 weeks, or until your health care provider approves. You may take showers after 48 hours. °Activity ° °· Do not drive for 24 hours if you were given a sedative during your procedure. °· Rest after the procedure. Return to your normal activities as told by your health care provider. Ask your health care provider what activities are safe for you. °· For 3 weeks, or for as long as told by your health care provider: °? Do not lift anything that is heavier than 10 lb (4.5 kg), or the limit that you are told. °? Do not play contact sports. °General instructions °· If you were sent home with a drain, follow instructions from your health care provider about how to care for it. °· Take deep breaths. This helps to prevent your lungs from developing an infection (pneumonia). °· Keep all follow-up visits as told by your health   care provider. This is important. °Contact a health care provider if: °· You have redness, swelling, or pain around an incision. °· You have fluid or blood coming from an incision. °· Your incision feels warm to the touch. °· You have pus or a bad smell coming from an incision or dressing. °· Your incision edges break open after your sutures have been removed. °· You have increasing pain in your shoulders. °· You feel dizzy or you faint. °· You develop shortness of breath. °· You keep feeling  nauseous or you are vomiting. °· You have diarrhea or you cannot control your bowel functions. °· You lose your appetite. °· You develop swelling or pain in your legs. °· You develop a rash. °Get help right away if you have: °· A fever. °· Difficulty breathing. °· Sharp pains in your chest. °Summary °· After a laparoscopic appendectomy, it is common to have little energy for normal activities, mild pain in the area of the incisions, and constipation. °· Infection is the most common complication after this procedure. Follow your health care provider's instructions about caring for yourself after the procedure. °· Rest after the procedure. Return to your normal activities as told by your health care provider. °· Contact your health care provider if you notice signs of infection around your incisions or you develop shortness of breath. Get help right away if you have a fever, chest pain, or difficulty breathing. °This information is not intended to replace advice given to you by your health care provider. Make sure you discuss any questions you have with your health care provider. °Document Released: 02/16/2005 Document Revised: 08/19/2017 Document Reviewed: 08/19/2017 °Elsevier Patient Education © 2020 [employer] ° °

## 2018-12-23 NOTE — Progress Notes (Signed)
Sharon Underwood was seen and treated in our hospital on 12/22/2018 through 12/23/2018   Treatment Team:  Attending Provider: Jules Husbands, MD Physician Assistant: Tylene Fantasia, PA-C

## 2018-12-23 NOTE — Discharge Summary (Signed)
  Patient ID: Sharon Underwood MRN: [id number] DOB/AGE: [birth year] 18 y.o.  Admit date: 12/22/2018 Discharge date: 12/23/2018   Discharge Diagnoses:  Active Problems:   Acute appendicitis   Appendicitis   Procedures:lap appy  Hospital Course: 18 yo admitted with findings consistent with acute appendicitis and  was taken promptly to the operating room for an uneventful laparoscopic appy.  Patient was kept overnight.  The time of discharge the patient was ambulating,  pain was controlled.  Her vital signs were stable and she was afebrile.   physical exam at discharge showed a pt  in no acute distress.  Awake and alert.  Abdomen: Soft incisions healing well without infection or peritonitis.  Extremities well-perfused and no edema.  Condition of the patient the time of discharge was stable   Disposition: Discharge disposition: 01-Home or Self Care       Discharge Instructions    Call MD for:  difficulty breathing, headache or visual disturbances   Complete by: As directed    Call MD for:  extreme fatigue   Complete by: As directed    Call MD for:  hives   Complete by: As directed    Call MD for:  persistant dizziness or light-headedness   Complete by: As directed    Call MD for:  persistant nausea and vomiting   Complete by: As directed    Call MD for:  redness, tenderness, or signs of infection (pain, swelling, redness, odor or green/yellow discharge around incision site)   Complete by: As directed    Call MD for:  severe uncontrolled pain   Complete by: As directed    Call MD for:  temperature >100.4   Complete by: As directed    Diet - low sodium heart healthy   Complete by: As directed    Discharge instructions   Complete by: As directed    Shower starting tomorrow, may apply ice packs   Increase activity slowly   Complete by: As directed    Lifting restrictions   Complete by: As directed    20 lbs x 6 wks   No wound care   Complete by: As directed      Allergies  as of 12/23/2018   No Known Allergies     Medication List    TAKE these medications   albuterol 108 (90 Base) MCG/ACT inhaler Commonly known as: VENTOLIN HFA Inhale 2 puffs into the lungs every 6 (six) hours as needed for wheezing or shortness of breath.   HYDROcodone-acetaminophen 5-325 MG tablet Commonly known as: NORCO/VICODIN Take 1-2 tablets by mouth every 6 (six) hours as needed for moderate pain.      Follow-up Information    Taeshawn Helfman, [place] F, MD. Go in 2 week(s).   Specialty: General Surgery Why: follow up appointment Wednesday 4th, @ 10:45am. If you have any complications please call office to be seen sooner. Contact information: [address] [place] [address] [phone]            Caroleen Hamman, MD FACS

## 2018-12-26 LAB — SURGICAL PATHOLOGY

## 2019-01-04 ENCOUNTER — Encounter: Payer: Medicaid Other | Admitting: Surgery

## 2019-03-04 ENCOUNTER — Other Ambulatory Visit: Payer: Self-pay

## 2019-03-04 ENCOUNTER — Emergency Department
Admission: EM | Admit: 2019-03-04 | Discharge: 2019-03-04 | Disposition: A | Discharge status: Home / Self Care | Payer: Medicaid Other | Attending: Emergency Medicine | Admitting: Emergency Medicine

## 2019-03-04 DIAGNOSIS — F321 Major depressive disorder, single episode, moderate: Secondary | ICD-10-CM | POA: Diagnosis not present

## 2019-03-04 DIAGNOSIS — R45851 Suicidal ideations: Secondary | ICD-10-CM

## 2019-03-04 DIAGNOSIS — F17210 Nicotine dependence, cigarettes, uncomplicated: Secondary | ICD-10-CM | POA: Diagnosis not present

## 2019-03-04 DIAGNOSIS — F329 Major depressive disorder, single episode, unspecified: Secondary | ICD-10-CM | POA: Diagnosis present

## 2019-03-04 DIAGNOSIS — F1910 Other psychoactive substance abuse, uncomplicated: Secondary | ICD-10-CM

## 2019-03-04 DIAGNOSIS — F19129 Other psychoactive substance abuse with intoxication, unspecified: Secondary | ICD-10-CM | POA: Diagnosis not present

## 2019-03-04 LAB — COMPREHENSIVE METABOLIC PANEL
ALT: 41 U/L (ref 0–44)
AST: 23 U/L (ref 15–41)
Albumin: 4.6 g/dL (ref 3.5–5.0)
Alkaline Phosphatase: 79 U/L (ref 38–126)
Anion gap: 10 (ref 5–15)
BUN: 11 mg/dL (ref 6–20)
CO2: 25 mmol/L (ref 22–32)
Calcium: 9.1 mg/dL (ref 8.9–10.3)
Chloride: 107 mmol/L (ref 98–111)
Creatinine, Ser: 0.58 mg/dL (ref 0.44–1.00)
GFR calc Af Amer: >60 mL/min (ref >60)
GFR calc non Af Amer: >60 mL/min (ref >60)
Glucose, Bld: 94 mg/dL (ref 70–99)
Potassium: 3.9 mmol/L (ref 3.5–5.1)
Sodium: 142 mmol/L (ref 135–145)
Total Bilirubin: 0.7 mg/dL (ref 0.3–1.2)
Total Protein: 7.5 g/dL (ref 6.5–8.1)

## 2019-03-04 LAB — URINE DRUG SCREEN, QUALITATIVE (ARMC ONLY)
Amphetamines, Ur Screen: NOT DETECTED
Barbiturates, Ur Screen: NOT DETECTED
Benzodiazepine, Ur Scrn: NOT DETECTED
Cannabinoid 50 Ng, Ur ~~LOC~~: POSITIVE — AB
Cocaine Metabolite,Ur ~~LOC~~: POSITIVE — AB
MDMA (Ecstasy)Ur Screen: NOT DETECTED
Methadone Scn, Ur: NOT DETECTED
Opiate, Ur Screen: NOT DETECTED
Phencyclidine (PCP) Ur S: NOT DETECTED
Tricyclic, Ur Screen: NOT DETECTED

## 2019-03-04 LAB — CBC
HCT: 37.3 % (ref 36.0–46.0)
Hemoglobin: 12.5 g/dL (ref 12.0–15.0)
MCH: 31 pg (ref 26.0–34.0)
MCHC: 33.5 g/dL (ref 30.0–36.0)
MCV: 92.6 fL (ref 80.0–100.0)
Platelets: 292 10*3/uL (ref 150–400)
RBC: 4.03 MIL/uL (ref 3.87–5.11)
RDW: 12.4 % (ref 11.5–15.5)
WBC: 10.9 10*3/uL — ABNORMAL HIGH (ref 4.0–10.5)
nRBC: 0 % (ref 0.0–0.2)

## 2019-03-04 LAB — SALICYLATE LEVEL: Salicylate Lvl: <7 mg/dL — ABNORMAL LOW (ref 7.0–30.0)

## 2019-03-04 LAB — ACETAMINOPHEN LEVEL: Acetaminophen (Tylenol), Serum: <10 ug/mL — ABNORMAL LOW (ref 10–30)

## 2019-03-04 LAB — ETHANOL: Alcohol, Ethyl (B): <10 mg/dL (ref <10)

## 2019-03-04 LAB — POCT PREGNANCY, URINE: Preg Test, Ur: NEGATIVE

## 2019-03-04 NOTE — ED Notes (Signed)
Hourly rounding reveals patient awake in hall bed. No complaints, stable, in no acute distress. Q15 minute rounds and monitoring via Rover and Officer to continue.  
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
## 2019-03-04 NOTE — ED Notes (Signed)
Report to include Situation, Background, Assessment, and Recommendations received from Sonjia RN. Patient alert and oriented, warm and dry, in no acute distress. Patient denies SI, HI, AVH and pain. Patient made aware of Q15 minute rounds and Rover and Officer presence for their safety. Patient instructed to come to me with needs or concerns.  
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
## 2019-03-04 NOTE — ED Notes (Signed)
Pt belongings:  [place] Si sports bra, white zip up sweatshirt, 1 pair of brown boots, black leggings, 1 pair black socks, 1 pair of underwear, hair band.  Pt did not bring a cell phone

## 2019-03-04 NOTE — ED Notes (Signed)
Patient is sitting in recliner in hallway. Blanket given to patient. Patient denies any discomfort at this time.

## 2019-03-04 NOTE — ED Notes (Signed)
Report given to Gary RN 

## 2019-03-04 NOTE — ED Notes (Signed)
Assumed care at this time. Pt in hallway in stretcher, nad noted- pt resting.

## 2019-03-04 NOTE — Discharge Instructions (Addendum)
Please follow-up with your doctor and seek further mental health treatment.  Return to the emergency room if you have worsening symptoms including feeling more depressed or having thoughts of self-harm.  You should avoid any drug use in the future.

## 2019-03-04 NOTE — ED Notes (Signed)
Hourly rounding reveals patient sleeping in hall bed. No complaints, stable, in no acute distress. Q15 minute rounds and monitoring via Rover and Officer to continue.  

## 2019-03-04 NOTE — ED Triage Notes (Signed)
Pt arrived via POV with reports of SI denies HI. Reports started feeling this way about 2 days ago. Denies plan, no prior hx of attempt.  No prior hx of anxiety or depression. Not currently on any medications.  Pt denies any AVH.    Drinks alcohol-few times per week, pt also uses marijuana about 2x per week.  Poor eye contact during triage.

## 2019-03-04 NOTE — ED Notes (Signed)
Patient's grandmother called. Patient elected not to speak to grandmother, but did give a message to the secretary to pass on. Patient is calm. NAD.

## 2019-03-04 NOTE — ED Notes (Signed)
Pt states she walked here to get help, pt notified friend of how she is feeling.

## 2019-04-04 ENCOUNTER — Encounter: Payer: Self-pay | Admitting: *Deleted

## 2019-04-29 ENCOUNTER — Emergency Department
Admission: EM | Admit: 2019-04-29 | Discharge: 2019-04-29 | Disposition: A | Discharge status: Home / Self Care | Payer: Medicaid Other | Attending: Emergency Medicine | Admitting: Emergency Medicine

## 2019-04-29 ENCOUNTER — Other Ambulatory Visit: Payer: Self-pay

## 2019-04-29 ENCOUNTER — Encounter: Payer: Self-pay | Admitting: Emergency Medicine

## 2019-04-29 DIAGNOSIS — N939 Abnormal uterine and vaginal bleeding, unspecified: Secondary | ICD-10-CM | POA: Diagnosis not present

## 2019-04-29 DIAGNOSIS — Z5321 Procedure and treatment not carried out due to patient leaving prior to being seen by health care provider: Secondary | ICD-10-CM | POA: Diagnosis not present

## 2019-04-29 HISTORY — DX: Unspecified ovarian cyst, unspecified side: N83.209

## 2019-04-29 LAB — COMPREHENSIVE METABOLIC PANEL
ALT: 23 U/L (ref 0–44)
AST: 19 U/L (ref 15–41)
Albumin: 5.3 g/dL — ABNORMAL HIGH (ref 3.5–5.0)
Alkaline Phosphatase: 74 U/L (ref 38–126)
Anion gap: 9 (ref 5–15)
BUN: 16 mg/dL (ref 6–20)
CO2: 27 mmol/L (ref 22–32)
Calcium: 9.9 mg/dL (ref 8.9–10.3)
Chloride: 103 mmol/L (ref 98–111)
Creatinine, Ser: 0.77 mg/dL (ref 0.44–1.00)
GFR calc Af Amer: >60 mL/min (ref >60)
GFR calc non Af Amer: >60 mL/min (ref >60)
Glucose, Bld: 91 mg/dL (ref 70–99)
Potassium: 3.9 mmol/L (ref 3.5–5.1)
Sodium: 139 mmol/L (ref 135–145)
Total Bilirubin: 0.8 mg/dL (ref 0.3–1.2)
Total Protein: 8.8 g/dL — ABNORMAL HIGH (ref 6.5–8.1)

## 2019-04-29 LAB — CBC
HCT: 46.2 % — ABNORMAL HIGH (ref 36.0–46.0)
Hemoglobin: 15 g/dL (ref 12.0–15.0)
MCH: 31.3 pg (ref 26.0–34.0)
MCHC: 32.5 g/dL (ref 30.0–36.0)
MCV: 96.5 fL (ref 80.0–100.0)
Platelets: 356 10*3/uL (ref 150–400)
RBC: 4.79 MIL/uL (ref 3.87–5.11)
RDW: 12 % (ref 11.5–15.5)
WBC: 11.5 10*3/uL — ABNORMAL HIGH (ref 4.0–10.5)
nRBC: 0 % (ref 0.0–0.2)

## 2019-04-29 LAB — URINALYSIS, COMPLETE (UACMP) WITH MICROSCOPIC
Bilirubin Urine: NEGATIVE
Glucose, UA: NEGATIVE mg/dL
Ketones, ur: 20 mg/dL — AB
Leukocytes,Ua: NEGATIVE
Nitrite: NEGATIVE
Protein, ur: NEGATIVE mg/dL
Specific Gravity, Urine: 1.018 (ref 1.005–1.030)
pH: 6 (ref 5.0–8.0)

## 2019-04-29 LAB — LIPASE, BLOOD: Lipase: 23 U/L (ref 11–51)

## 2019-04-29 LAB — POCT PREGNANCY, URINE: Preg Test, Ur: NEGATIVE

## 2019-04-29 NOTE — ED Triage Notes (Signed)
Patient reports 2 days of large amount of vaginal bleeding and lower abdominal cramping. Patient states that she has her period 2 weeks ago but that it was lighter then normal.

## 2019-04-29 NOTE — ED Notes (Signed)
Patient up to stat desk. Patient informed registration staff that she was leaving without further treatment/assessment.

## 2019-09-25 ENCOUNTER — Emergency Department
Admission: EM | Admit: 2019-09-25 | Discharge: 2019-09-25 | Discharge status: Against Medical Advice | Payer: Medicaid Other

## 2019-09-25 ENCOUNTER — Other Ambulatory Visit: Payer: Self-pay

## 2019-09-25 NOTE — ED Notes (Signed)
Pt called x's 3, no response ?

## 2019-10-05 ENCOUNTER — Ambulatory Visit: Payer: Self-pay

## 2020-12-30 IMAGING — US US PELVIS COMPLETE WITH TRANSVAGINAL
1 series · 13 of 25 positions shown · non-contrast
Comparison: None

CLINICAL DATA: Pelvic pain, vaginal bleeding



[Series 1: us pelvis complete with transvaginal · 13 of 86 slices shown]
[im 1/86]
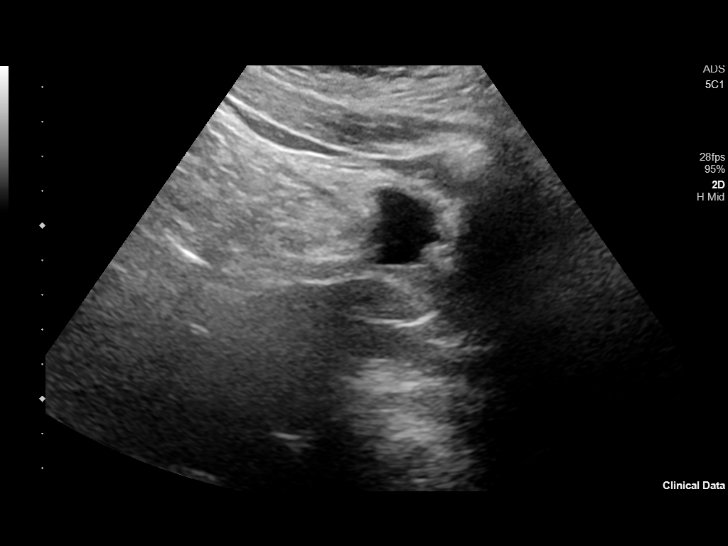
[im 8/86]
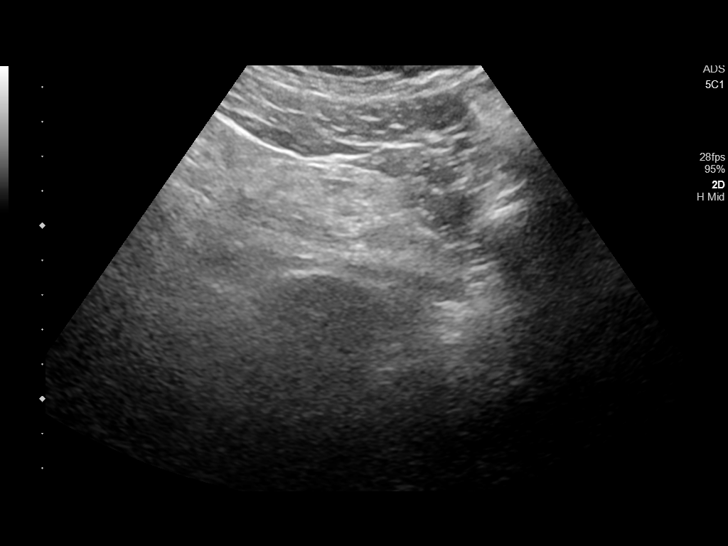
[im 15/86]
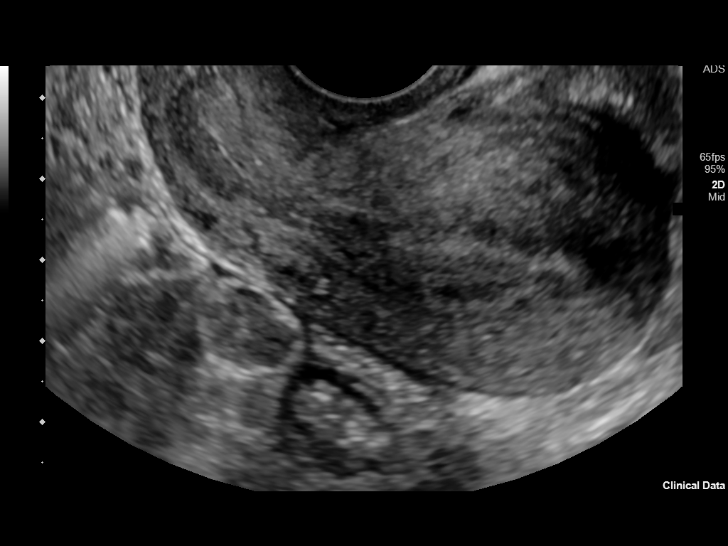
[im 22/86]
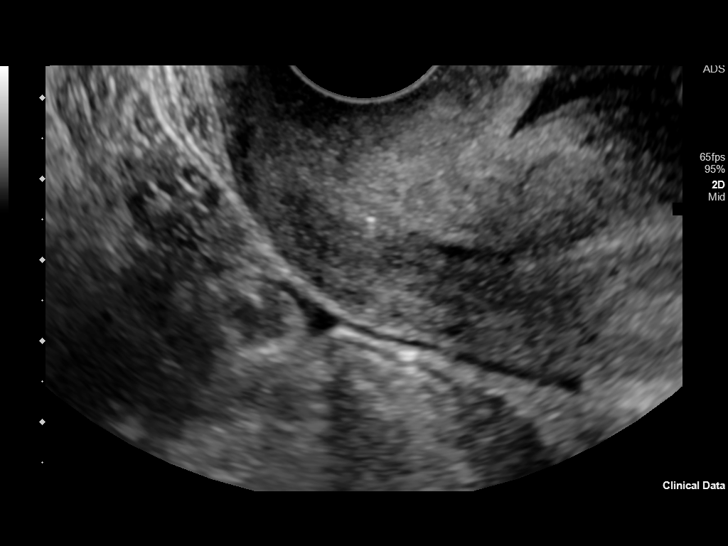
[im 29/86]
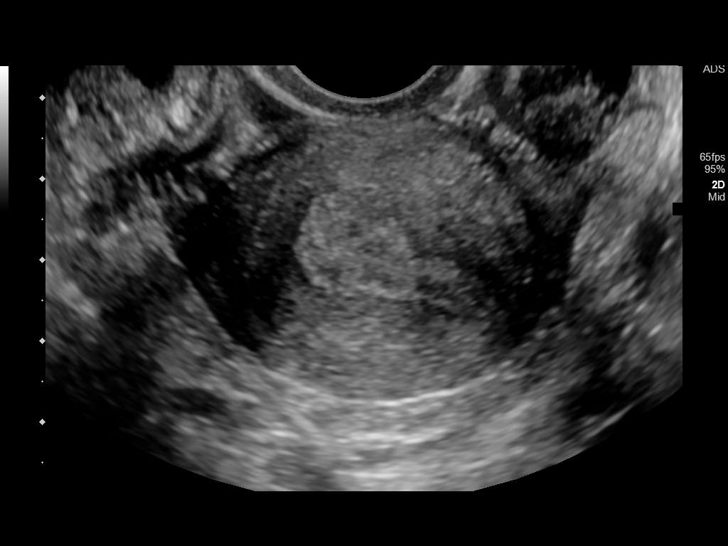
[im 36/86]
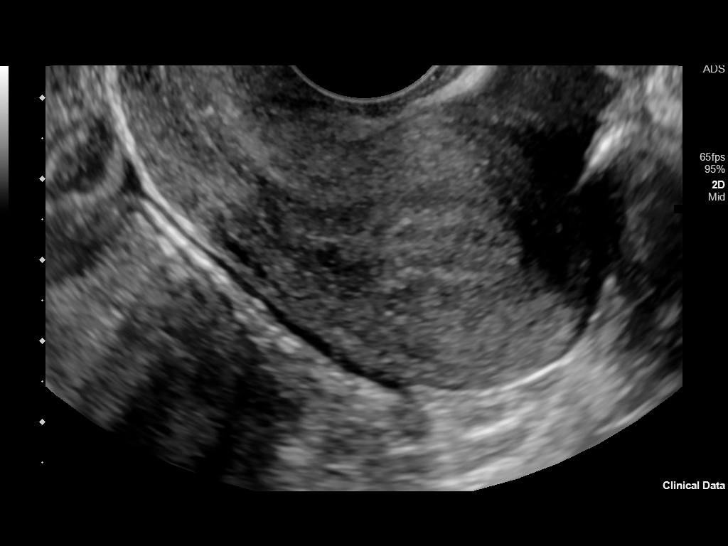
[im 43/86]
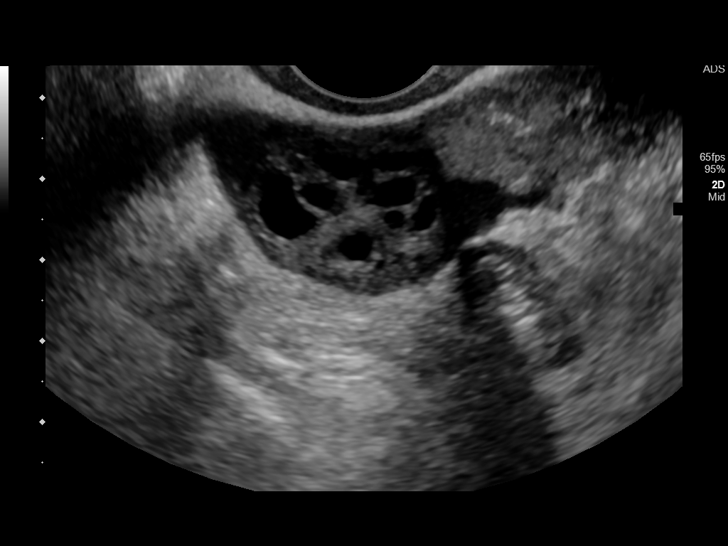
[im 50/86]
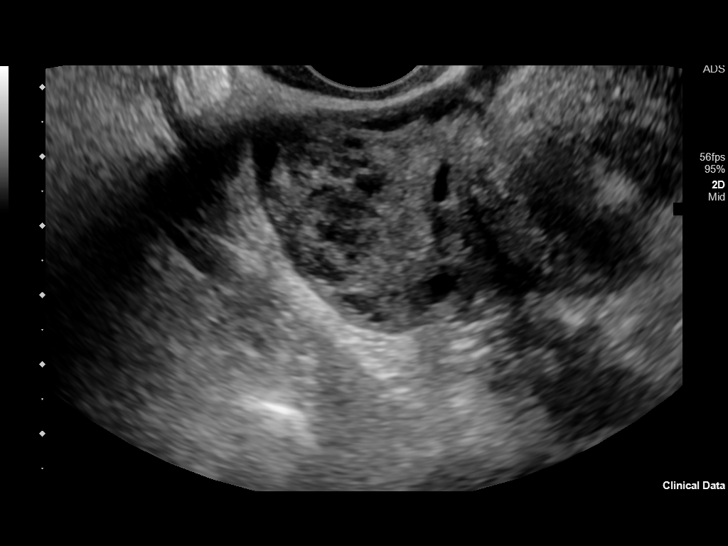
[im 57/86]
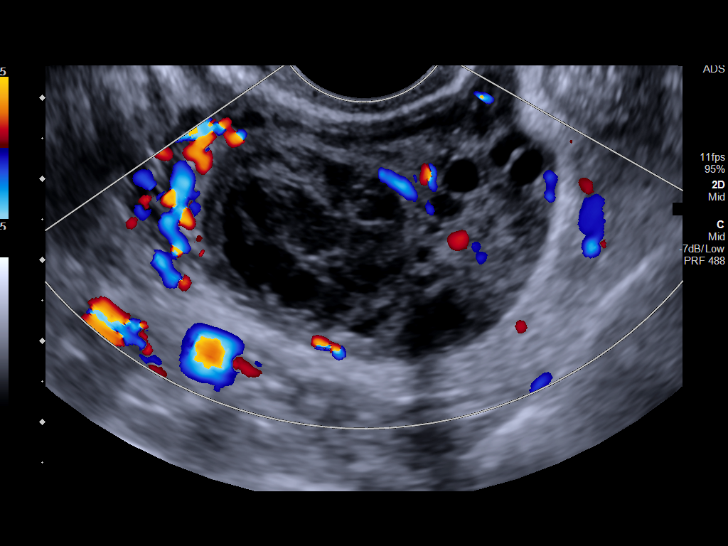
[im 64/86]
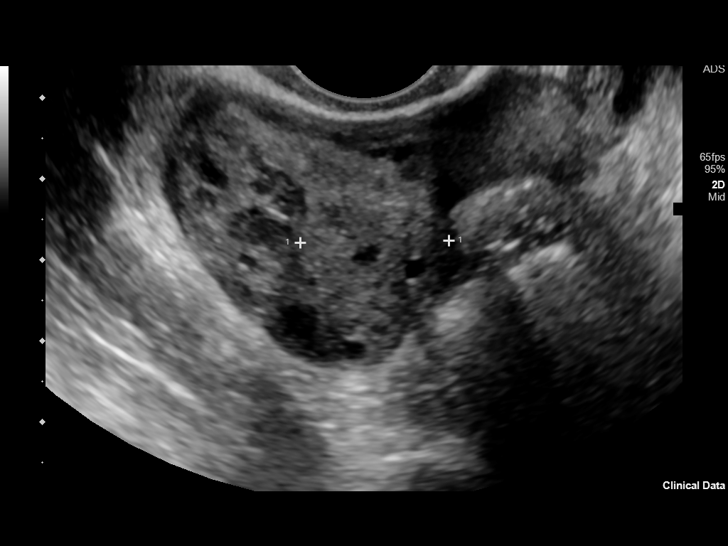
[im 71/86]
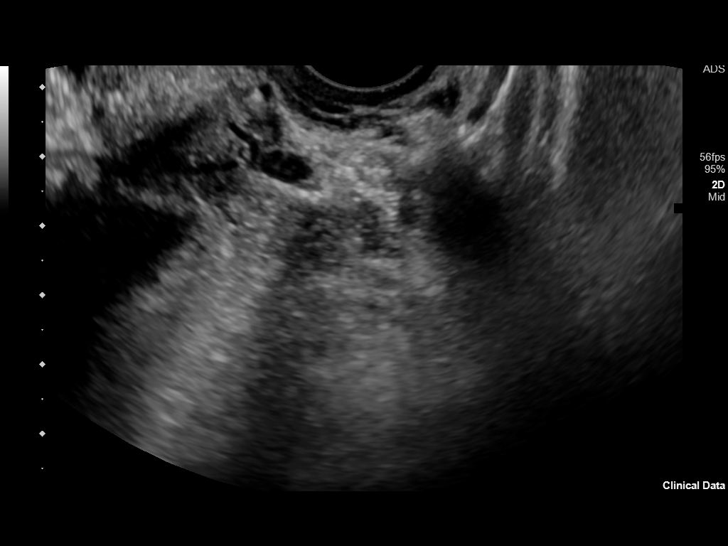
[im 78/86]
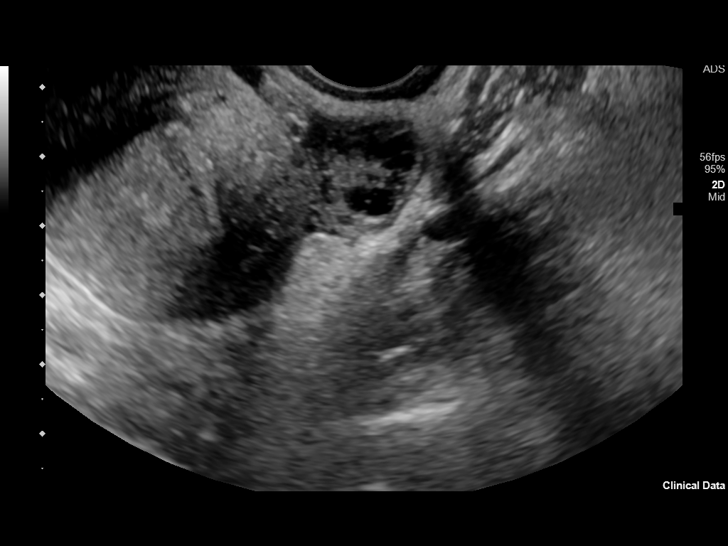
[im 86/86]
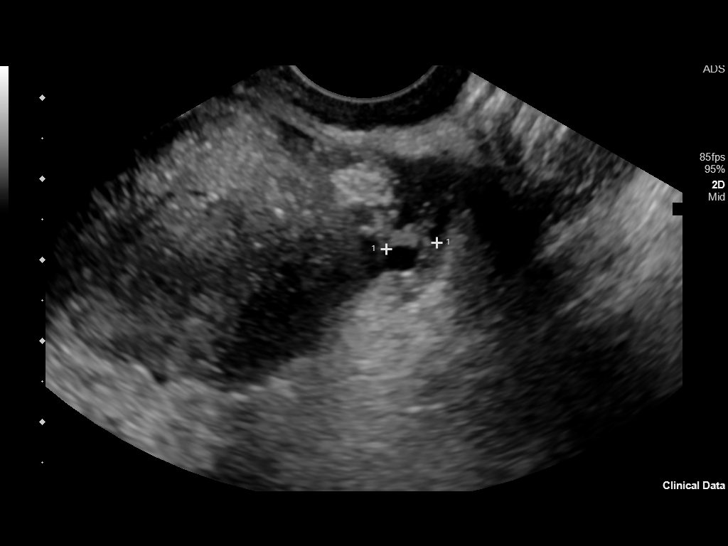

[13 of 25 positions shown; findings below may reference images not displayed]

FINDINGS: Uterus

Measurements: 7.0 x 3.6 x 3.7 cm = volume: 49 mL. No fibroids or
other mass visualized. Uterus is retroverted.

Endometrium

Thickness: 11 mm.  No focal abnormality visualized.

Right ovary

Measurements: 3.2 x 1.6 x 1.8 cm = volume: 4.9 mL. Complex
hypoechoic area noted in the right adnexa measures 3.4 x 2.3 x
cm. Favor this represents a hemorrhagic cyst within the right ovary.
This also could be tubular.

Left ovary

Measurements: 1.0 x 1.4 x 0.6 = volume: 0.4 mL. Normal appearance/no
adnexal mass.

Other findings

No abnormal free fluid.
IMPRESSION: Complex hypoechoic area within the right adnexa, favor hemorrhagic
right ovarian cyst although this could be tubular in origin as well.
This could be followed with repeat ultrasound in 3-6 months to
ensure resolution.

## 2021-02-26 IMAGING — CR DG CHEST 2V
2 series · 2 of 2 positions shown · non-contrast
Comparison: Radiograph 07/02/2017

CLINICAL DATA: Chest pain.

EXAM:
CHEST - 2 VIEW

[chest pa]
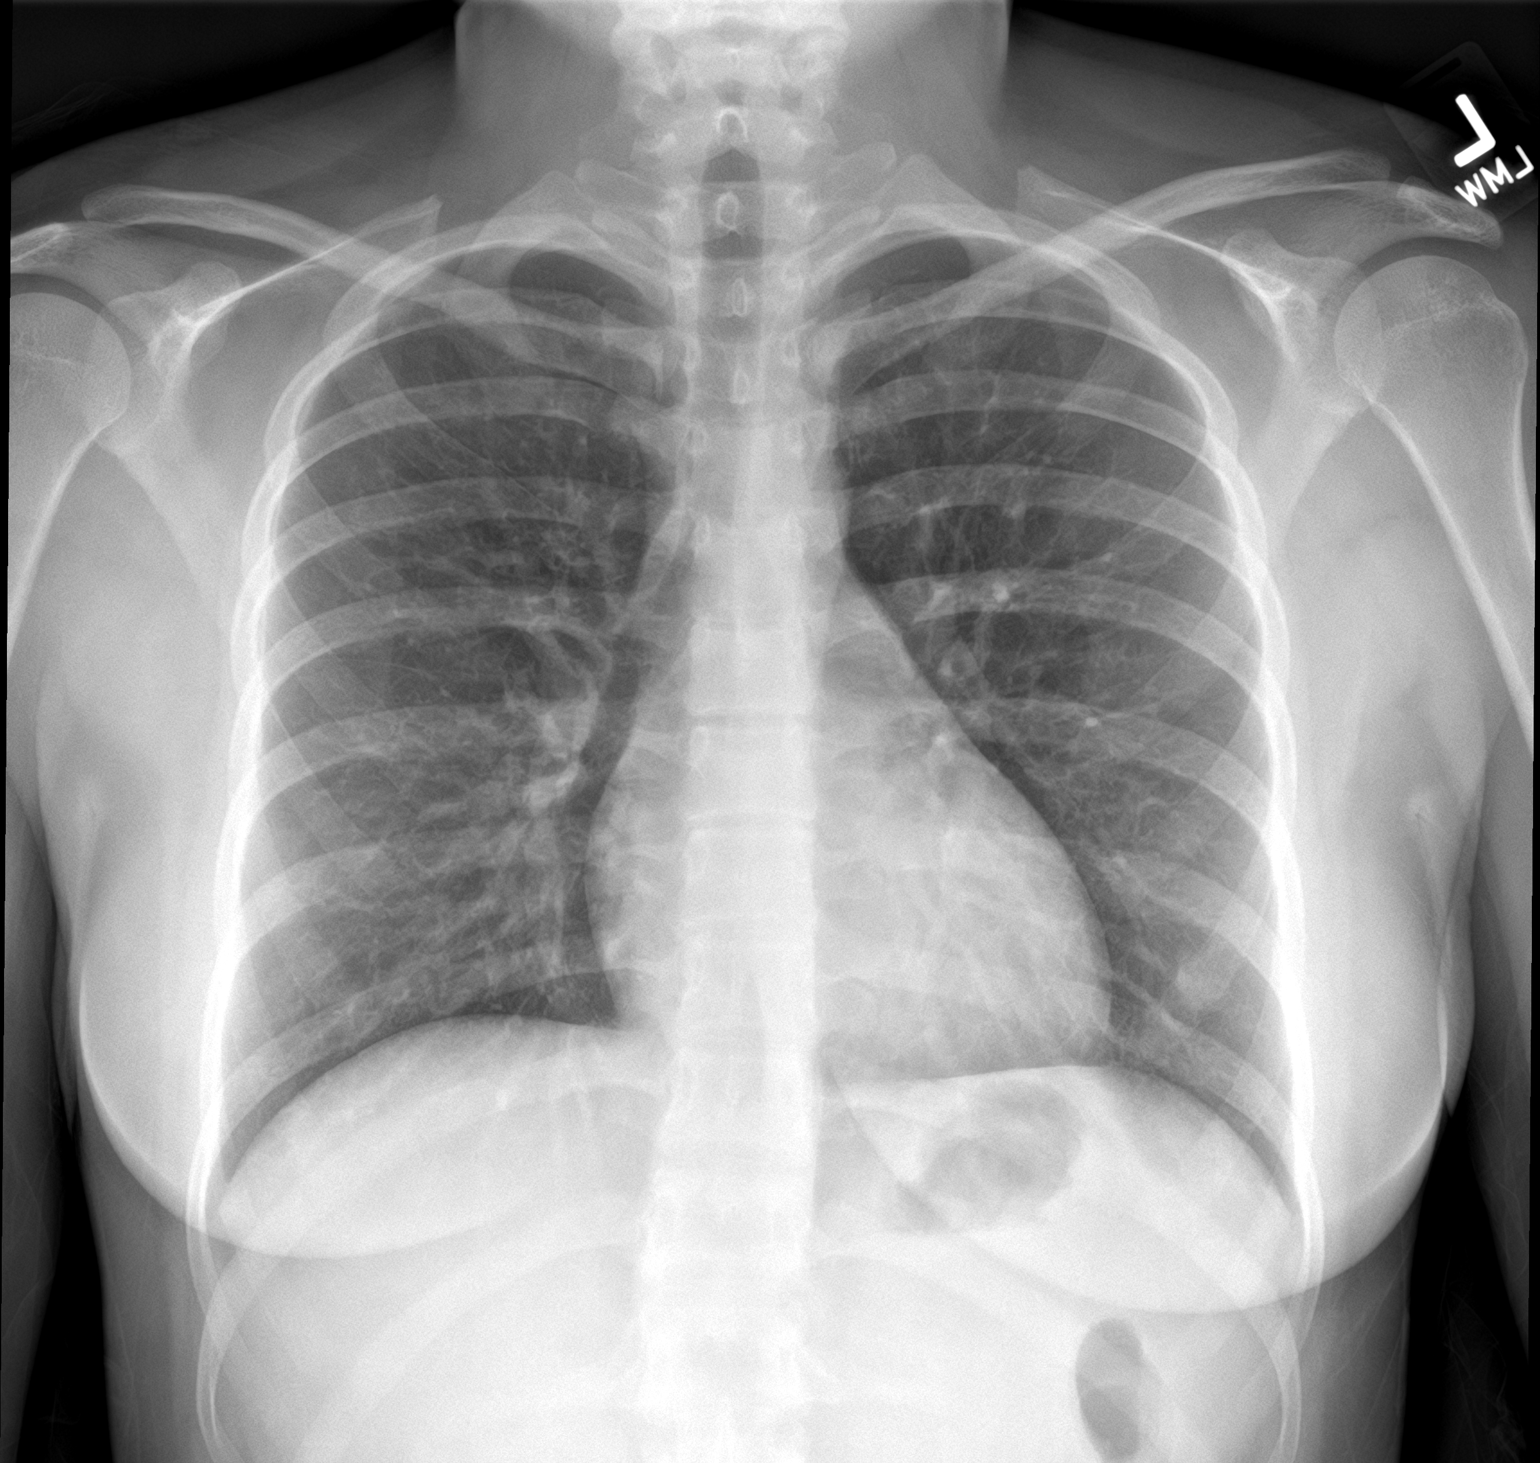

[chest lat]
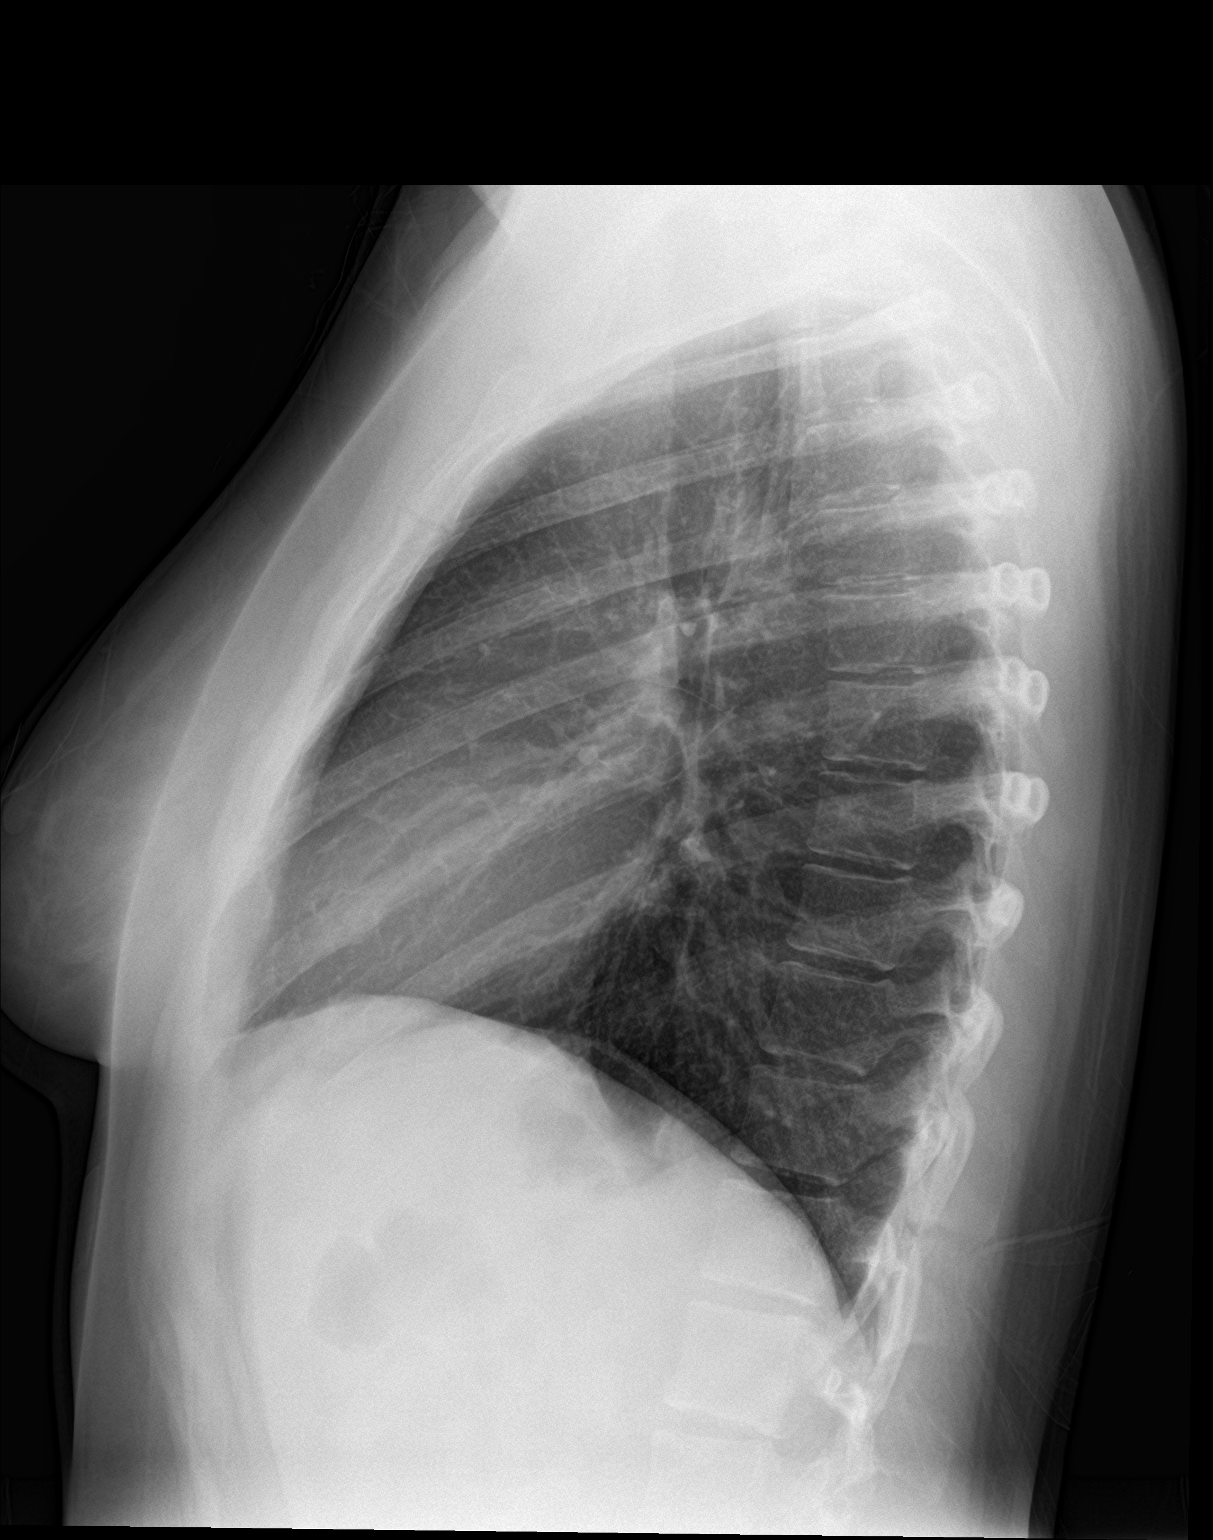

[2 of 2 positions shown; findings below may reference images not displayed]

FINDINGS: The cardiomediastinal contours are normal. Bronchial thickening
which has improved from 7960 exam. Pulmonary vasculature is normal.
No consolidation, pleural effusion, or pneumothorax. No acute
osseous abnormalities are seen.
IMPRESSION: Persistent but improved bronchial thickening from 7960 exam. No new
abnormality.
